# Patient Record
Sex: Female | Born: 1995
Health system: Southern US, Community
[De-identification: ages and names within clinical notes are randomized; demographics above are authoritative.]

## PROBLEM LIST (undated history)

## (undated) DIAGNOSIS — D573 Sickle-cell trait: Secondary | ICD-10-CM

## (undated) HISTORY — DX: Sickle-cell trait: D57.3

---
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
## 2013-08-22 ENCOUNTER — Ambulatory Visit (INDEPENDENT_AMBULATORY_CARE_PROVIDER_SITE_OTHER): Payer: 59 | Admitting: Family Medicine

## 2013-08-22 ENCOUNTER — Encounter: Payer: Self-pay | Admitting: Family Medicine

## 2013-08-22 VITALS — BP 102/67 | HR 85 | Temp 99.8°F | Wt 129.0 lb

## 2013-08-22 DIAGNOSIS — J039 Acute tonsillitis, unspecified: Secondary | ICD-10-CM

## 2013-08-22 DIAGNOSIS — J029 Acute pharyngitis, unspecified: Secondary | ICD-10-CM

## 2013-08-22 LAB — POCT RAPID STREP A (OFFICE): Rapid Strep A Screen: NEGATIVE

## 2013-08-22 MED ORDER — PREDNISONE 20 MG PO TABS: 40.0000 mg | ORAL_TABLET | Freq: Every day | ORAL | Status: DC

## 2013-08-22 NOTE — Addendum Note (Signed)
Addended by: Chalmers CaterUTTLE, Annalie Wenner H on: 08/22/2013 11:50 AM   Modules accepted: Orders

## 2013-08-22 NOTE — Progress Notes (Signed)
   Subjective:    Patient ID: Jacqueline Frederick, female    DOB: [birth year], 18 y.o.   MRN: [id number]  HPI Sore throat & fever for 3 days. She reports her boy friend has been sick. Pain with swallowing.  No cough, or congestion. No ear pain.  Boyfriend has tonsillitis. No meds.   Review of Systems     Objective:   Physical Exam  Constitutional: She is oriented to person, place, and time. She appears well-developed and well-nourished.  HENT:  Head: Normocephalic and atraumatic.  Right Ear: External ear normal.  Left Ear: External ear normal.  Nose: Nose normal.  Mouth/Throat: Oropharynx is clear and moist.  TMs and canals are clear.   Eyes: Conjunctivae and EOM are normal. Pupils are equal, round, and reactive to light.  Neck: Neck supple. No thyromegaly present.  Tonsils are swollen bilaterally with white spots. No significant erythema. She does have some mild cervical lymphadenopathy.  Cardiovascular: Normal rate, regular rhythm and normal heart sounds.   Pulmonary/Chest: Effort normal and breath sounds normal. She has no wheezes.  Lymphadenopathy:    She has cervical adenopathy.  Neurological: She is alert and oriented to person, place, and time.  Skin: Skin is warm and dry.  Psychiatric: She has a normal mood and affect.          Assessment & Plan:  Pharyngitis/tonsillitis - strep is negative today. Will send culture. Will hold off on antibiotics at this time. Will prescribe prednisone for 5 days. Call if feels like she suddenly getting worse or fever spikes. Make sure staying well hydrated.

## 2013-08-23 LAB — STREP A DNA PROBE: GASP: NEGATIVE

## 2013-08-25 ENCOUNTER — Encounter: Payer: Self-pay | Admitting: Physician Assistant

## 2013-08-25 ENCOUNTER — Ambulatory Visit (INDEPENDENT_AMBULATORY_CARE_PROVIDER_SITE_OTHER): Payer: 59 | Admitting: Physician Assistant

## 2013-08-25 VITALS — BP 99/61 | HR 67 | Ht 64.0 in | Wt 132.0 lb

## 2013-08-25 DIAGNOSIS — Z13220 Encounter for screening for lipoid disorders: Secondary | ICD-10-CM

## 2013-08-25 DIAGNOSIS — Z3009 Encounter for other general counseling and advice on contraception: Secondary | ICD-10-CM

## 2013-08-25 DIAGNOSIS — Z30011 Encounter for initial prescription of contraceptive pills: Secondary | ICD-10-CM

## 2013-08-25 DIAGNOSIS — Z131 Encounter for screening for diabetes mellitus: Secondary | ICD-10-CM

## 2013-08-25 DIAGNOSIS — Z0000 Encounter for general adult medical examination without abnormal findings: Secondary | ICD-10-CM

## 2013-08-25 MED ORDER — NORGESTIM-ETH ESTRAD TRIPHASIC 0.18/0.215/0.25 MG-25 MCG PO TABS: 1.0000 | ORAL_TABLET | Freq: Every day | ORAL | Status: DC

## 2013-08-25 NOTE — Progress Notes (Signed)
  Subjective:     Jacqueline Frederick is a 18 y.o. female and is here for a comprehensive physical exam. The patient reports no problems.  Patient would like to start birth control. She is not currently sexually active but may plan to be in the future. She has tried the birth of her patch at one point but did not like it. She was like to try pills today.  History   Social History  . Marital Status: Single    Spouse Name: N/A    Number of Children: N/A  . Years of Education: N/A   Occupational History  . student     Social History Main Topics  . Smoking status: Never Smoker   . Smokeless tobacco: Never Used  . Alcohol Use: No  . Drug Use: No  . Sexual Activity: Not on file   Other Topics Concern  . Not on file   Social History Narrative   [employer].    Health Maintenance  Topic Date Due  . Pap Smear  04/23/2011  . Influenza Vaccine  08/26/2013  . Tetanus/tdap  08/23/2021    The following portions of the patient's history were reviewed and updated as appropriate: allergies, current medications, past family history, past medical history, past social history, past surgical history and problem list.  Review of Systems A comprehensive review of systems was negative.   Objective:    BP 99/61  Pulse 67  Ht 5\' 4"  (1.626 m)  Wt 132 lb (59.875 kg)  BMI 22.65 kg/m2  LMP 08/25/2013 General appearance: alert, cooperative and appears stated age Head: Normocephalic, without obvious abnormality, atraumatic Eyes: conjunctivae/corneas clear. PERRL, EOM's intact. Fundi benign. Ears: normal TM's and external ear canals both ears Nose: Nares normal. Septum midline. Mucosa normal. No drainage or sinus tenderness. Throat: lips, mucosa, and tongue normal; teeth and gums normal Neck: no adenopathy, no carotid bruit, no JVD, supple, symmetrical, trachea midline and thyroid not enlarged, symmetric, no tenderness/mass/nodules Back: symmetric, no curvature. ROM normal. No CVA tenderness. Lungs:  clear to auscultation bilaterally Heart: regular rate and rhythm, S1, S2 normal, no murmur, click, rub or gallop Abdomen: soft, non-tender; bowel sounds normal; no masses,  no organomegaly Extremities: extremities normal, atraumatic, no cyanosis or edema Pulses: 2+ and symmetric Skin: Skin color, texture, turgor normal. No rashes or lesions Lymph nodes: Cervical, supraclavicular, and axillary nodes normal. Neurologic: Grossly normal    Assessment:    Healthy female exam.      Plan:     CPE- pap smear not due until next year. Vaccines up to date. Patient has not had screening labs done. Discussed benefit of having a baseline. Fasting labs are printed and given to the patient. Encouraged patient to continue exercise to maintain a healthy weight. Patient encouraged to go ahead and start a multivitamin with calcium and vitamin D. Encouraged at least 1200 mg or 4 servings of calcium daily.  Contraception management-discuss side effects of oral contraception. Discussed start date and what to do if this dose. Started patient on Ortho Tri-Cyclen Lo. Refills given for one year. Encouraged patient that this does not protect against sexually transmitted diseases. Use of condoms is important. If become sexually active please consider STD testing. See After Visit Summary for Counseling Recommendations

## 2013-08-25 NOTE — Patient Instructions (Addendum)
Oral Contraception Information Oral contraceptive pills (OCPs) are medicines taken to prevent pregnancy. OCPs work by preventing the ovaries from releasing eggs. The hormones in OCPs also cause the cervical mucus to thicken, preventing the sperm from entering the uterus. The hormones also cause the uterine lining to become thin, not allowing a fertilized egg to attach to the inside of the uterus. OCPs are highly effective when taken exactly as prescribed. However, OCPs do not prevent sexually transmitted diseases (STDs). Safe sex practices, such as using condoms along with the pill, can help prevent STDs.  Before taking the pill, you may have a physical exam and Pap test. Your health care provider may order blood tests. The health care provider will make sure you are a good candidate for oral contraception. Discuss with your health care provider the possible side effects of the OCP you may be prescribed. When starting an OCP, it can take 2 to 3 months for the body to adjust to the changes in hormone levels in your body.  TYPES OF ORAL CONTRACEPTION  The combination pill--This pill contains estrogen and progestin (synthetic progesterone) hormones. The combination pill comes in 21-day, 28-day, or 91-day packs. Some types of combination pills are meant to be taken continuously (365-day pills). With 21-day packs, you do not take pills for 7 days after the last pill. With 28-day packs, the pill is taken every day. The last 7 pills are without hormones. Certain types of pills have more than 21 hormone-containing pills. With 91-day packs, the first 84 pills contain both hormones, and the last 7 pills contain no hormones or contain estrogen only.  The minipill--This pill contains the progesterone hormone only. The pill is taken every day continuously. It is very important to take the pill at the same time each day. The minipill comes in packs of 28 pills. All 28 pills contain the hormone.  ADVANTAGES OF ORAL  CONTRACEPTIVE PILLS  Decreases premenstrual symptoms.   Treats menstrual period cramps.   Regulates the menstrual cycle.   Decreases a heavy menstrual flow.   May treatacne, depending on the type of pill.   Treats abnormal uterine bleeding.   Treats polycystic ovarian syndrome.   Treats endometriosis.   Can be used as emergency contraception.  THINGS THAT CAN MAKE ORAL CONTRACEPTIVE PILLS LESS EFFECTIVE OCPs can be less effective if:   You forget to take the pill at the same time every day.   You have a stomach or intestinal disease that lessens the absorption of the pill.   You take OCPs with other medicines that make OCPs less effective, such as antibiotics, certain HIV medicines, and some seizure medicines.   You take expired OCPs.   You forget to restart the pill on day 7, when using the packs of 21 pills.  RISKS ASSOCIATED WITH ORAL CONTRACEPTIVE PILLS  Oral contraceptive pills can sometimes cause side effects, such as:  Headache.  Nausea.  Breast tenderness.  Irregular bleeding or spotting. Combination pills are also associated with a small increased risk of:  Blood clots.  Heart attack.  Stroke. Document Released: 04/04/2002 Document Revised: 11/02/2012 Document Reviewed: 07/03/2012 [facility] Patient Information 2015 [place]. This information is not intended to replace advice given to you by your health care provider. Make sure you discuss any questions you have with your health care provider.  Keeping You Healthy  Get These Tests 1. Blood Pressure- Have your blood pressure checked once a year by your health care provider.  Normal blood pressure  is 120/80. 2. Weight- Have your body mass index (BMI) calculated to screen for obesity.  BMI is measure of body fat based on height and weight.  You can also calculate your own BMI at [email or web address].west-esparza.[email or web address] 3. Cholesterol- Have your cholesterol checked every 5 years starting at age 18  then yearly starting at age 54. 4. Chlamydia, HIV, and other sexually transmitted diseases- Get screened every year until age 68, then within three months of each new sexual provider. 5. Pap Smear- Every 1-3 years; discuss with your health care provider. 6. Mammogram- Every year starting at age 53  Take these medicines  Calcium with Vitamin D-Your body needs 1200 mg of Calcium each day and [phone] IU of Vitamin D daily.  Your body can only absorb 500 mg of Calcium at a time so Calcium must be taken in 2 or 3 divided doses throughout the day.  Multivitamin with folic acid- Once daily if it is possible for you to become pregnant.  Get these Immunizations  Gardasil-Series of three doses; prevents HPV related illness such as genital warts and cervical cancer.  Menactra-Single dose; prevents meningitis.  Tetanus shot- Every 10 years.  Flu shot-Every year.  Take these steps 1. Do not smoke-Your healthcare provider can help you quit.  For tips on how to quit go to www.smokefree.gov or call 1-800 QUITNOW. 2. Be physically active- Exercise 5 days a week for at least 30 minutes.  If you are not already physically active, start slow and gradually work up to 30 minutes of moderate physical activity.  Examples of moderate activity include walking briskly, dancing, swimming, bicycling, etc. 3. Breast Cancer- A self breast exam every month is important for early detection of breast cancer.  For more information and instruction on self breast exams, ask your healthcare provider or [email or web address]. 4. Eat a healthy diet- Eat a variety of healthy foods such as fruits, vegetables, whole grains, low fat milk, low fat cheeses, yogurt, lean meats, poultry and fish, beans, nuts, tofu, etc.  For more information go to www. Thenutritionsource.org 5. Drink alcohol in moderation- Limit alcohol intake to one drink or less per day. Never drink and drive. 6. Depression- Your emotional  health is as important as your physical health.  If you're feeling down or losing interest in things you normally enjoy please talk to your healthcare provider about being screened for depression. 7. Dental visit- Brush and floss your teeth twice daily; visit your dentist twice a year. 8. Eye doctor- Get an eye exam at least every 2 years. 9. Helmet use- Always wear a helmet when riding a bicycle, motorcycle, rollerblading or skateboarding. 10. Safe sex- If you may be exposed to sexually transmitted infections, use a condom. 11. Seat belts- Seat belts can save your live; always wear one. 12. Smoke/Carbon Monoxide detectors- These detectors need to be installed on the appropriate level of your home. Replace batteries at least once a year. 13. Skin cancer- When out in the sun please cover up and use sunscreen 15 SPF or higher. 14. Violence- If anyone is threatening or hurting you, please tell your healthcare provider.

## 2013-08-29 ENCOUNTER — Other Ambulatory Visit: Payer: Self-pay | Admitting: *Deleted

## 2013-08-29 ENCOUNTER — Telehealth: Payer: Self-pay | Admitting: *Deleted

## 2013-08-29 MED ORDER — NORGESTIM-ETH ESTRAD TRIPHASIC 0.18/0.215/0.25 MG-35 MCG PO TABS: 1.0000 | ORAL_TABLET | Freq: Every day | ORAL | Status: DC

## 2013-08-29 NOTE — Telephone Encounter (Signed)
Pt called and wanted to know if her ocp can be switched due to not being covered under ins.Loralee PacasBarkley, Temeca Somma [place]

## 2014-07-20 ENCOUNTER — Ambulatory Visit (INDEPENDENT_AMBULATORY_CARE_PROVIDER_SITE_OTHER): Payer: 59 | Admitting: Family Medicine

## 2014-07-20 ENCOUNTER — Encounter: Payer: Self-pay | Admitting: Family Medicine

## 2014-07-20 VITALS — BP 96/65 | HR 56 | Wt 127.0 lb

## 2014-07-20 DIAGNOSIS — R0602 Shortness of breath: Secondary | ICD-10-CM | POA: Diagnosis not present

## 2014-07-20 LAB — CBC
HEMATOCRIT: 38.9 % (ref 36.0–46.0)
Hemoglobin: 12.7 g/dL (ref 12.0–15.0)
MCH: 26.6 pg (ref 26.0–34.0)
MCHC: 32.6 g/dL (ref 30.0–36.0)
MCV: 81.6 fL (ref 78.0–100.0)
MPV: 10.3 fL (ref 8.6–12.4)
Platelets: 261 10*3/uL (ref 150–400)
RBC: 4.77 MIL/uL (ref 3.87–5.11)
RDW: 14.2 % (ref 11.5–15.5)
WBC: 3.9 10*3/uL — AB (ref 4.0–10.5)

## 2014-07-20 NOTE — Progress Notes (Signed)
CC: Jacqueline Frederick is a 19 y.o. female is here for weak and dizzy at times   Subjective: HPI:  For the past 2 weeks she will have episodes where she feels like she is becoming short of breath. Occurring 1-3 times a day. They always occur when she is sitting stationary or lying down. It begins with feeling like she's hyperventilating then dizziness. If she concentrates on her breathing for a few minutes the symptoms will pass. She denies any cough, wheezing, nor chest discomfort. She denies any exertional component to her symptoms. Symptoms have been persistent since onset and mild in severity. She denies fevers, chills, headache, hearing loss, vision disturbance, nasal congestion, sore throat or edema. Denies irregular heart beat or rapid heartbeat.she usually takes her pulse while she is having these episodes and she tells me its irregular rhythm and rate.   Review Of Systems Outlined In HPI  Past Medical History  Diagnosis Date  . Sickle cell trait   . Asthma     No past surgical history on file. Family History  Problem Relation Age of Onset  . Breast cancer Maternal Grandmother     History   Social History  . Marital Status: Single    Spouse Name: N/A  . Number of Children: N/A  . Years of Education: N/A   Occupational History  . student     Social History Main Topics  . Smoking status: Never Smoker   . Smokeless tobacco: Never Used  . Alcohol Use: No  . Drug Use: No  . Sexual Activity: Not on file   Other Topics Concern  . Not on file   Social History Narrative   [employer].      Objective: BP 96/65 mmHg  Pulse 56  Wt 127 lb (57.607 kg)  General: Alert and Oriented, No Acute Distress HEENT: Pupils equal, round, reactive to light. Conjunctivae clear.  External ears unremarkable, canals clear with intact TMs with appropriate landmarks.  Middle ear appears open without effusion. Pink inferior turbinates.  Moist mucous membranes, pharynx without inflammation nor  lesions.  Neck supple without palpable lymphadenopathy nor abnormal masses. Lungs: Clear to auscultation bilaterally, no wheezing/ronchi/rales.  Comfortable work of breathing. Good air movement. Extremities: No peripheral edema.  Strong peripheral pulses.  Mental Status: No depression, anxiety, nor agitation. Skin: Warm and dry.  Assessment & Plan: Chanae was seen today for weak and dizzy at times.  Diagnoses and all orders for this visit:  Shortness of breath Orders: -     Cancel: CBC -     CBC   Shortness of breath: We'll rule out anemia since she states that she does have heavy cycles every month. If this is normal but 2 of [place] have concluded that is most likely psychogenic and she'll think about medication to help her deal with stress.  Return if symptoms worsen or fail to improve.

## 2014-11-01 ENCOUNTER — Other Ambulatory Visit: Payer: Self-pay | Admitting: *Deleted

## 2014-11-01 ENCOUNTER — Other Ambulatory Visit: Payer: Self-pay | Admitting: Family Medicine

## 2015-07-29 ENCOUNTER — Other Ambulatory Visit (HOSPITAL_COMMUNITY)
Admission: RE | Admit: 2015-07-29 | Discharge: 2015-07-29 | Disposition: A | Discharge status: Home / Self Care | Payer: 59 | Source: Ambulatory Visit | Attending: Family Medicine | Admitting: Family Medicine

## 2015-07-29 ENCOUNTER — Encounter: Payer: Self-pay | Admitting: Family Medicine

## 2015-07-29 ENCOUNTER — Ambulatory Visit (INDEPENDENT_AMBULATORY_CARE_PROVIDER_SITE_OTHER): Payer: 59 | Admitting: Family Medicine

## 2015-07-29 VITALS — BP 97/63 | HR 62 | Wt 145.0 lb

## 2015-07-29 DIAGNOSIS — Z23 Encounter for immunization: Secondary | ICD-10-CM

## 2015-07-29 DIAGNOSIS — Z114 Encounter for screening for human immunodeficiency virus [HIV]: Secondary | ICD-10-CM

## 2015-07-29 DIAGNOSIS — Z01419 Encounter for gynecological examination (general) (routine) without abnormal findings: Secondary | ICD-10-CM | POA: Diagnosis not present

## 2015-07-29 DIAGNOSIS — D573 Sickle-cell trait: Secondary | ICD-10-CM | POA: Diagnosis not present

## 2015-07-29 DIAGNOSIS — Z0000 Encounter for general adult medical examination without abnormal findings: Secondary | ICD-10-CM | POA: Diagnosis not present

## 2015-07-29 MED ORDER — MICROGESTIN FE 1/20 1-20 MG-MCG PO TABS: ORAL_TABLET | ORAL | Status: DC

## 2015-07-29 NOTE — Progress Notes (Signed)
  Subjective:     Jacqueline Frederick is a 20 y.o. female and is here for a comprehensive physical exam. The patient reports no problems.She is potentially interested in switching to Nexplanon instead of oral contraceptives. She is currently sexually active. She rarely drinks alcohol. She denies smoking. She does exercise some but plans on starting back into a regular routine.  Social History   Social History  . Marital Status: Single    Spouse Name: N/A  . Number of Children: N/A  . Years of Education: N/A   Occupational History  . student     Social History Main Topics  . Smoking status: Never Smoker   . Smokeless tobacco: Never Used  . Alcohol Use: No  . Drug Use: No  . Sexual Activity: Not on file   Other Topics Concern  . Not on file   Social History Narrative   Jacqueline Frederick.    Health Maintenance  Topic Date Due  . HIV Screening  04/22/2008  . PAP SMEAR  04/23/2014  . INFLUENZA VACCINE  08/27/2015  . TETANUS/TDAP  08/23/2021    The following portions of the patient's history were reviewed and updated as appropriate: allergies, current medications, past family history, past medical history, past social history, past surgical history and problem list.  Review of Systems A comprehensive review of systems was negative.   Objective:    BP 97/63 mmHg  Pulse 62  Wt 145 lb (65.772 kg)  SpO2 100% General appearance: alert, cooperative and appears stated age Head: Normocephalic, without obvious abnormality, atraumatic Eyes: conj clear, EOMI, PEERLA Ears: normal TM's and external ear canals both ears Nose: Nares normal. Septum midline. Mucosa normal. No drainage or sinus tenderness. Throat: lips, mucosa, and tongue normal; teeth and gums normal Neck: no adenopathy, no carotid bruit, no JVD, supple, symmetrical, trachea midline and thyroid not enlarged, symmetric, no tenderness/mass/nodules Back: symmetric, no curvature. ROM normal. No CVA tenderness. Lungs: clear to  auscultation bilaterally Breasts: normal appearance, no masses or tenderness Heart: regular rate and rhythm, S1, S2 normal, no murmur, click, rub or gallop Abdomen: soft, non-tender; bowel sounds normal; no masses,  no organomegaly Pelvic: cervix normal in appearance, external genitalia normal, no adnexal masses or tenderness, no cervical motion tenderness, rectovaginal septum normal, uterus normal size, shape, and consistency and vagina normal without discharge Extremities: extremities normal, atraumatic, no cyanosis or edema Pulses: 2+ and symmetric Skin: Skin color, texture, turgor normal. No rashes or lesions Lymph nodes: Cervical, supraclavicular, and axillary nodes normal. Neurologic: Alert and oriented X 3, normal strength and tone. Normal symmetric reflexes. Normal coordination and gait    Assessment:    Healthy female exam.      Plan:     See After Visit Summary for Counseling Recommendations  Keep up a regular exercise program and make sure you are eating a healthy diet Try to eat 4 servings of dairy a day, or if you are lactose intolerant take a calcium with vitamin D daily.  Your vaccines are up to date.   Given second Gardisil vaccine today.  Additional information and handout provided for Nexplanon.

## 2015-07-29 NOTE — Addendum Note (Signed)
Addended by: Deno EtienneBARKLEY, Fuller Makin L on: 07/29/2015 04:30 PM   Modules accepted: Orders, SmartSet

## 2015-07-29 NOTE — Patient Instructions (Signed)
Etonogestrel implant What is this medicine? ETONOGESTREL (et oh noe JES trel) is a contraceptive (birth control) device. It is used to prevent pregnancy. It can be used for up to 3 years. This medicine may be used for other purposes; ask your health care provider or pharmacist if you have questions. What should I tell my health care provider before I take this medicine? They need to know if you have any of these conditions: -abnormal vaginal bleeding -blood vessel disease or blood clots -cancer of the breast, cervix, or liver -depression -diabetes -gallbladder disease -headaches -heart disease or recent heart attack -high blood pressure -high cholesterol -kidney disease -liver disease -renal disease -seizures -tobacco smoker -an unusual or allergic reaction to etonogestrel, other hormones, anesthetics or antiseptics, medicines, foods, dyes, or preservatives -pregnant or trying to get pregnant -breast-feeding How should I use this medicine? This device is inserted just under the skin on the inner side of your upper arm by a health care professional. Talk to your pediatrician regarding the use of this medicine in children. Special care may be needed. Overdosage: If you think you have taken too much of this medicine contact a poison control center or emergency room at once. NOTE: This medicine is only for you. Do not share this medicine with others. What if I miss a dose? This does not apply. What may interact with this medicine? Do not take this medicine with any of the following medications: -amprenavir -bosentan -fosamprenavir This medicine may also interact with the following medications: -barbiturate medicines for inducing sleep or treating seizures -certain medicines for fungal infections like ketoconazole and itraconazole -griseofulvin -medicines to treat seizures like carbamazepine, felbamate, oxcarbazepine, phenytoin,  topiramate -modafinil -phenylbutazone -rifampin -some medicines to treat HIV infection like atazanavir, indinavir, lopinavir, nelfinavir, tipranavir, ritonavir -St. John's wort This list may not describe all possible interactions. Give your health care provider a list of all the medicines, herbs, non-prescription drugs, or dietary supplements you use. Also tell them if you smoke, drink alcohol, or use illegal drugs. Some items may interact with your medicine. What should I watch for while using this medicine? This product does not protect you against HIV infection (AIDS) or other sexually transmitted diseases. You should be able to feel the implant by pressing your fingertips over the skin where it was inserted. Contact your doctor if you cannot feel the implant, and use a non-hormonal birth control method (such as condoms) until your doctor confirms that the implant is in place. If you feel that the implant may have broken or become bent while in your arm, contact your healthcare provider. What side effects may I notice from receiving this medicine? Side effects that you should report to your doctor or health care professional as soon as possible: -allergic reactions like skin rash, itching or hives, swelling of the face, lips, or tongue -breast lumps -changes in emotions or moods -depressed mood -heavy or prolonged menstrual bleeding -pain, irritation, swelling, or bruising at the insertion site -scar at site of insertion -signs of infection at the insertion site such as fever, and skin redness, pain or discharge -signs of pregnancy -signs and symptoms of a blood clot such as breathing problems; changes in vision; chest pain; severe, sudden headache; pain, swelling, warmth in the leg; trouble speaking; sudden numbness or weakness of the face, arm or leg -signs and symptoms of liver injury like dark yellow or brown urine; general ill feeling or flu-like symptoms; light-colored stools; loss of  appetite; nausea; right upper belly   pain; unusually weak or tired; yellowing of the eyes or skin -unusual vaginal bleeding, discharge -signs and symptoms of a stroke like changes in vision; confusion; trouble speaking or understanding; severe headaches; sudden numbness or weakness of the face, arm or leg; trouble walking; dizziness; loss of balance or coordination Side effects that usually do not require medical attention (Report these to your doctor or health care professional if they continue or are bothersome.): -acne -back pain -breast pain -changes in weight -dizziness -general ill feeling or flu-like symptoms -headache -irregular menstrual bleeding -nausea -sore throat -vaginal irritation or inflammation This list may not describe all possible side effects. Call your doctor for medical advice about side effects. You may report side effects to FDA at 1-800-FDA-1088. Where should I keep my medicine? This drug is given in a hospital or clinic and will not be stored at home. NOTE: This sheet is a summary. It may not cover all possible information. If you have questions about this medicine, talk to your doctor, pharmacist, or health care provider.    2016, Elsevier/Gold Standard. (2013-10-27 14:07:06)  

## 2015-08-01 LAB — CYTOLOGY - PAP

## 2015-08-20 ENCOUNTER — Encounter: Payer: Self-pay | Admitting: Osteopathic Medicine

## 2015-08-20 ENCOUNTER — Ambulatory Visit (INDEPENDENT_AMBULATORY_CARE_PROVIDER_SITE_OTHER): Payer: 59 | Admitting: Osteopathic Medicine

## 2015-08-20 VITALS — BP 108/71 | HR 62 | Wt 153.0 lb

## 2015-08-20 DIAGNOSIS — Z30017 Encounter for initial prescription of implantable subdermal contraceptive: Secondary | ICD-10-CM

## 2015-08-20 LAB — POCT URINE PREGNANCY: Preg Test, Ur: NEGATIVE

## 2015-08-20 MED ORDER — ETONOGESTREL 68 MG ~~LOC~~ IMPL: 1.0000 | DRUG_IMPLANT | Freq: Once | SUBCUTANEOUS | 0 refills | Status: DC

## 2015-08-20 NOTE — Patient Instructions (Signed)
Etonogestrel implant What is this medicine? ETONOGESTREL (et oh noe JES trel) is a contraceptive (birth control) device. It is used to prevent pregnancy. It can be used for up to 3 years. This medicine may be used for other purposes; ask your health care provider or pharmacist if you have questions. What should I tell my health care provider before I take this medicine? They need to know if you have any of these conditions: -abnormal vaginal bleeding -blood vessel disease or blood clots -cancer of the breast, cervix, or liver -depression -diabetes -gallbladder disease -headaches -heart disease or recent heart attack -high blood pressure -high cholesterol -kidney disease -liver disease -renal disease -seizures -tobacco smoker -an unusual or allergic reaction to etonogestrel, other hormones, anesthetics or antiseptics, medicines, foods, dyes, or preservatives -pregnant or trying to get pregnant -breast-feeding How should I use this medicine? This device is inserted just under the skin on the inner side of your upper arm by a health care professional. Talk to your pediatrician regarding the use of this medicine in children. Special care may be needed. Overdosage: If you think you have taken too much of this medicine contact a poison control center or emergency room at once. NOTE: This medicine is only for you. Do not share this medicine with others. What if I miss a dose? This does not apply. What may interact with this medicine? Do not take this medicine with any of the following medications: -amprenavir -bosentan -fosamprenavir This medicine may also interact with the following medications: -barbiturate medicines for inducing sleep or treating seizures -certain medicines for fungal infections like ketoconazole and itraconazole -griseofulvin -medicines to treat seizures like carbamazepine, felbamate, oxcarbazepine, phenytoin,  topiramate -modafinil -phenylbutazone -rifampin -some medicines to treat HIV infection like atazanavir, indinavir, lopinavir, nelfinavir, tipranavir, ritonavir -St. John's wort This list may not describe all possible interactions. Give your health care provider a list of all the medicines, herbs, non-prescription drugs, or dietary supplements you use. Also tell them if you smoke, drink alcohol, or use illegal drugs. Some items may interact with your medicine. What should I watch for while using this medicine? This product does not protect you against HIV infection (AIDS) or other sexually transmitted diseases. You should be able to feel the implant by pressing your fingertips over the skin where it was inserted. Contact your doctor if you cannot feel the implant, and use a non-hormonal birth control method (such as condoms) until your doctor confirms that the implant is in place. If you feel that the implant may have broken or become bent while in your arm, contact your healthcare provider. What side effects may I notice from receiving this medicine? Side effects that you should report to your doctor or health care professional as soon as possible: -allergic reactions like skin rash, itching or hives, swelling of the face, lips, or tongue -breast lumps -changes in emotions or moods -depressed mood -heavy or prolonged menstrual bleeding -pain, irritation, swelling, or bruising at the insertion site -scar at site of insertion -signs of infection at the insertion site such as fever, and skin redness, pain or discharge -signs of pregnancy -signs and symptoms of a blood clot such as breathing problems; changes in vision; chest pain; severe, sudden headache; pain, swelling, warmth in the leg; trouble speaking; sudden numbness or weakness of the face, arm or leg -signs and symptoms of liver injury like dark yellow or brown urine; general ill feeling or flu-like symptoms; light-colored stools; loss of  appetite; nausea; right upper belly   pain; unusually weak or tired; yellowing of the eyes or skin -unusual vaginal bleeding, discharge -signs and symptoms of a stroke like changes in vision; confusion; trouble speaking or understanding; severe headaches; sudden numbness or weakness of the face, arm or leg; trouble walking; dizziness; loss of balance or coordination Side effects that usually do not require medical attention (Report these to your doctor or health care professional if they continue or are bothersome.): -acne -back pain -breast pain -changes in weight -dizziness -general ill feeling or flu-like symptoms -headache -irregular menstrual bleeding -nausea -sore throat -vaginal irritation or inflammation This list may not describe all possible side effects. Call your doctor for medical advice about side effects. You may report side effects to FDA at 1-800-FDA-1088. Where should I keep my medicine? This drug is given in a hospital or clinic and will not be stored at home. NOTE: This sheet is a summary. It may not cover all possible information. If you have questions about this medicine, talk to your doctor, pharmacist, or health care provider.    2016, Elsevier/Gold Standard. (2013-10-27 14:07:06)  

## 2015-08-20 NOTE — Progress Notes (Signed)
HPI: Jacqueline Frederick is a 20 y.o. Not Hispanic or Latino female  who presents to [facility] [place] today, 08/20/15,  for chief complaint of:  Chief Complaint  Patient presents with  . Contraception    CONSULT FOR NEXPLANON    LMP - on period now. Previously on OCP as noted below, consistent with this medication. Has considered other options for contraception and is interested in Nexplanon arm implant.  Past medical, surgical, social and family history reviewed: Past Medical History:  Diagnosis Date  . Asthma   . Sickle cell trait (HCC)    No past surgical history on file. Social History  Substance Use Topics  . Smoking status: Never Smoker  . Smokeless tobacco: Never Used  . Alcohol use No   Family History  Problem Relation Age of Onset  . Breast cancer Maternal Grandmother      Current medication list and allergy/intolerance information reviewed:   Current Outpatient Prescriptions  Medication Sig Dispense Refill  . MICROGESTIN FE 1/20 1-20 MG-MCG tablet TAKE 1 T PO QD 1 Package 11   No current facility-administered medications for this visit.    No Known Allergies    Review of Systems:  Constitutional:  No  fever, no chills, No recent illness,  Genitourinary: No  abnormal genital bleeding, No abnormal genital discharge  Skin: No  Rash, No other wounds/concerning lesions  Exam:  BP 108/71   Pulse 62   Wt 153 lb (69.4 kg)   BMI 26.26 kg/m   Constitutional: VS see above. General Appearance: alert, well-developed, well-nourished, NAD  Skin: warm, dry, intact. No rash/ulcer.   Psychiatric: Normal judgment/insight. Normal mood and affect. Oriented x3.    Results for orders placed or performed in visit on 08/20/15 (from the past 24 hour(s))  POCT urine pregnancy     Status: None   Collection Time: 08/20/15 11:13 AM  Result Value Ref Range   Preg Test, Ur Negative Negative     ASSESSMENT/PLAN:   Nexplanon insertion  -  uneventful. All questions answered. Post procedure care was reviewed with the patient.   NEXPLANON INSERTION PRE-OP DIAGNOSIS: desired long-term, reversible contraception  POST-OP DIAGNOSIS: Same  PROCEDURE: Nexplanon  placement Performing Physician: Dr. Sunnie Nielsen  Risks and benefits reviewed with the patient. Informed consent obtained, patient opts to proceed today.    PROCEDURE:  Site (check): left arm  Lot #  Sterile Preparation: Chlorhexidine   Insertion site was selected 8 - 10 cm from medial epicondyle and marked along with guiding site using sterile marker  Procedure area was prepped and draped in a sterile fashion.3 mL of 1% lidocaine with epinephrine used for subcutaneous anesthesia. Anesthesia confirmed.  Nexplanon  trocar was inserted subcutaneously and then Nexplanon  capsule delivered subcutaneously Trocar was removed from the insertion site. Nexplanon  capsule was palpated by provider and patient to assure satisfactory placement. Estimated blood loss <1 mL Dressings applied: Steri-Strip and small pressure bandage Followup: The patient tolerated the procedure well without complications.  Standard post-procedure care is explained and return precautions are given.  Visit summary with medication list and pertinent instructions was printed for patient to review. All questions at time of visit were answered - patient instructed to contact office with any additional concerns. ER/RTC precautions were reviewed with the patient. Follow-up plan: Return as needed, and as directed by PCP for routine care.

## 2016-04-13 ENCOUNTER — Telehealth: Payer: Self-pay

## 2016-04-13 DIAGNOSIS — F322 Major depressive disorder, single episode, severe without psychotic features: Secondary | ICD-10-CM

## 2016-04-13 NOTE — Telephone Encounter (Signed)
Pt's mother called and stated that pt is having problems they think is related to her Nexplanon implant.  She has made an appointment for tomorrow afternoon with Lyn Hollingsheadlexander to have it removed.  She is also requesting a behavioral health referral.  Please advise.

## 2016-04-13 NOTE — Telephone Encounter (Signed)
OK to place referral for downstairs.

## 2016-04-14 ENCOUNTER — Ambulatory Visit (INDEPENDENT_AMBULATORY_CARE_PROVIDER_SITE_OTHER): Payer: 59 | Admitting: Osteopathic Medicine

## 2016-04-14 VITALS — BP 93/53 | HR 67 | Ht 63.0 in

## 2016-04-14 DIAGNOSIS — F322 Major depressive disorder, single episode, severe without psychotic features: Secondary | ICD-10-CM | POA: Diagnosis not present

## 2016-04-14 DIAGNOSIS — Z3046 Encounter for surveillance of implantable subdermal contraceptive: Secondary | ICD-10-CM

## 2016-04-14 DIAGNOSIS — T887XXA Unspecified adverse effect of drug or medicament, initial encounter: Secondary | ICD-10-CM | POA: Diagnosis not present

## 2016-04-14 NOTE — Telephone Encounter (Signed)
Dr Dreama SaaAtkar is going on vacation and doesn't have any appointments until mid April, so [place].

## 2016-04-14 NOTE — Telephone Encounter (Signed)
Notified. 

## 2016-04-14 NOTE — Patient Instructions (Signed)
Sutures need to come out in 10-14 days .  Anyone who is having thoughts of depression or suicide needs urgent evaluation to stay safe! You can always come talk to us or go to the nearest emergency room.

## 2016-04-14 NOTE — Telephone Encounter (Signed)
OK, thank you. Please let family know they should expect a call soon.

## 2016-04-14 NOTE — Progress Notes (Addendum)
HPI: Jacqueline Frederick is a 21 y.o. female  who presents to [facility] today, 04/14/16,  for chief complaint of:  Chief Complaint  Patient presents with  . Other    NEXPLANON REMOVAL    Nexplanon removal - concerned this may be causing depression.    Recently moved back home from [place]. Hasn't been eating. Sleeping all the time. No Hx depression or anxiety in the past. Patient is barely talking, mom provides most of history  Patient is accompanied by mom who assists with history-taking.   Past medical history, surgical history, social history and family history reviewed.  Patient Active Problem List   Diagnosis Date Noted  . Seborrheic dermatitis of scalp 04/09/2012  . Sickle cell trait (HCC) 02/06/2011    Current medication list and allergy/intolerance information reviewed.   Current Outpatient Prescriptions on File Prior to Visit  Medication Sig Dispense Refill  . etonogestrel (NEXPLANON) 68 MG IMPL implant 1 each (68 mg total) by Subdermal route once. Inserted 08/20/15 1 each 0   No current facility-administered medications on file prior to visit.    No Known Allergies    Review of Systems:  Constitutional: No recent illness  Cardiac: No  chest pain  Neurologic: No  weakness, No  Dizziness  Psychiatric: +concerns with depression, No  concerns with anxiety  Exam:  BP (!) 93/53   Pulse 67   Ht 5\' 3"  (1.6 m)   Constitutional: VS see above. General Appearance: well-developed, well-nourished, NAD.   Musculoskeletal: Gait normal. Symmetric and independent movement of all extremities  Neurological: Normal balance/coordination. No tremor.  Skin: warm, dry, intact.   Psychiatric: Poor judgment/insight. Flat mood and affect. Unable to determine thought disorder - pt will not respond verbally to most questions. No SI/HI.    Nexplanon Removal Procedure Note PRE-OP DIAGNOSIS: Nexplanon, desire for removal d/t depression POST-OP  DIAGNOSIS: Same  PROCEDURE: Nexplanon Removal  Performing Physician:A A;exander PROCEDURE:  Anesthesia: 2% Xylocaine 5 ml  Procedure: Consent obtained. A time-out was performed prior to initiating procedure to be sure of right patient and right location. The area surrounding the Nexplanon was prepared in the usual sterile manner. The site was anesthetized with lidocaine. A skin incision was made over the distal aspect of the device. Lack of LIdo w/ Epi on backorder - bleeding caused visibility problems. Tourniquet applied. Dr. Lucienne Minkshekkekandam's assistance was appreciated with remainde rof removal procedure. The capsule lysed sharply and the device removed using a hemostat. Hemostasis was assured. The site was closed with 4.0 prolene x3 simple interrupted suture and pressure dressing. The patient tolerated the procedure well.  Followup: The patient tolerated the procedure well without complications. Standard post-procedure care is explained and return precautions are given. Contraception is advised until conception is desired.    ASSESSMENT/PLAN:   Depression, major, single episode, severe (HCC) - Mom taking for urgent evaluation at [facility]. Safety plan in place and followup arranged w/ psych already  Nexplanon removal - Assistance of Dr. Benjamin Stainhekkekandam was appreciated    Patient Instructions  Sutures need to come out in 10-14 days .  Anyone who is having thoughts of depression or suicide needs urgent evaluation to stay safe! You can always come talk to us or go to the nearest emergency room.     Follow-up plan: Return for depression ASAP, suture removal 10-14 days.  Visit summary with medication list and pertinent instructions was printed for patient to review, alert us if any changes needed. All questions  at time of visit were answered - patient instructed to contact office with any additional concerns. ER/RTC precautions were reviewed with the patient and understanding verbalized.

## 2016-04-15 NOTE — Progress Notes (Signed)
  Procedure:  Removal of Nexplanon Risks, benefits, and alternatives explained and consent obtained. Time out conducted. Local anesthesia and initial incision performed by Dr. Sunnie NielsenNatalie Alexander. There was great difficulty with finding the Nexplanon for removal. I was called for further assistance, a tourniquet was applied to keep the operative field free of blood, using both sharp and blunt dissection as well as slightly extending the length of the incision proximally I was able to locate the Nexplanon device and remove it. Incision closure performed by Dr. Lyn HollingsheadAlexander. Hemostasis achieved. Pt stable.

## 2016-04-16 ENCOUNTER — Ambulatory Visit (INDEPENDENT_AMBULATORY_CARE_PROVIDER_SITE_OTHER): Payer: 59 | Admitting: Family Medicine

## 2016-04-16 ENCOUNTER — Encounter: Payer: Self-pay | Admitting: Family Medicine

## 2016-04-16 VITALS — BP 104/67 | HR 64 | Ht 63.0 in | Wt 149.0 lb

## 2016-04-16 DIAGNOSIS — F418 Other specified anxiety disorders: Secondary | ICD-10-CM | POA: Diagnosis not present

## 2016-04-16 MED ORDER — SERTRALINE HCL 50 MG PO TABS: ORAL_TABLET | ORAL | 1 refills | Status: DC

## 2016-04-16 NOTE — Patient Instructions (Signed)
Try to exercise for 20-30 minutes a day. We will work on getting referral to a therapist/counselor We will go ahead and start Zoloft and I'll see back in 3 weeks. Try to get family medical leave paperwork from your employer. They can fax it to our office directly if he would like.

## 2016-04-16 NOTE — Progress Notes (Signed)
Subjective:    CC: Depression/Anxiety   HPI:  21 year old female recently moved back to the area after living in [place]. She is here today with her mother. She actually came in earlier this week to remove her On as she was concerned that it actually could be causing some mood shifts including some symptoms of anxiety and depression. Per mom's report she has not been eating and has been sleeping all the time. In fact when she saw the other provider in our office she had recommended that she go to old been [facility] for a same-day consultation just to make sure that she was safe. Family felt that they did not feel that she needs to be admitted for inpatient behavioral health care at that time so she is following up. Today.  He comes in today feeling a little better. She is interested in getting some therapy and counseling. She has not had anything scary or traumatic her happen to her. She does not feel unsafe. She did break out, a relationship in January which has been difficult for her. She was working full time at [employer] in [place]. She started school at one point but was not actively taking classes at this time. She is now back on temporal rarely with her parents. She says they are very supportive and helping her get better. Else down and depressed and hopeless as well as feeling very nervous and anxious every day. She has had thoughts of wanting to harm herself but was evaluated [facility] ago and they felt that she was safe enough to go home. She still feels like she is safe to go home.  Past medical history, Surgical history, Family history not pertinant except as noted below, Social history, Allergies, and medications have been entered into the medical record, reviewed, and corrections made.   Review of Systems: No fevers, chills, night sweats, weight loss, chest pain, or shortness of breath.   Objective:    General: Well Developed, well nourished, and in no acute distress.   Neuro: Alert and oriented x3, extra-ocular muscles intact, sensation grossly intact.  HEENT: Normocephalic, atraumatic  Skin: Warm and dry, no rashes. Cardiac: Regular rate and rhythm, no murmurs rubs or gallops, no lower extremity edema.  Respiratory: Clear to auscultation bilaterally. Not using accessory muscles, speaking in full sentences.   Impression and Recommendations:    Anxiety/depression- Discussed options with her. Encouraged her to start exercising regularly. She can start with just walking for 2030 minutes a day. Also discussed referral for therapy/counseling the just be very helpful at this time in her life when she struggling. We also discussed putting her on a prescription medication. She was open to all 3 options and said she would like to do all 3. We will start with Zoloft and I'll see her back in 3 weeks. One about potential for side effects. We'll go ahead and then start exercise program. And we actually already placed the pedicle health referral 2 days ago.

## 2016-04-21 ENCOUNTER — Telehealth: Payer: Self-pay

## 2016-04-21 NOTE — Telephone Encounter (Signed)
Called pt and lvm informing her that we do not have the fmla forms and she can either fax it to our office or drop this off.Loralee PacasBarkley, Jarrod Mcenery [place]

## 2016-04-21 NOTE — Telephone Encounter (Signed)
ok 

## 2016-04-21 NOTE — Telephone Encounter (Signed)
Pt called with fax # for FMLA paperwork [phone]

## 2016-04-23 ENCOUNTER — Ambulatory Visit (INDEPENDENT_AMBULATORY_CARE_PROVIDER_SITE_OTHER): Payer: 59 | Admitting: Psychiatry

## 2016-04-23 ENCOUNTER — Ambulatory Visit: Payer: 59 | Admitting: Obstetrics & Gynecology

## 2016-04-23 ENCOUNTER — Encounter (HOSPITAL_COMMUNITY): Payer: Self-pay | Admitting: Psychiatry

## 2016-04-23 VITALS — BP 102/64 | HR 55 | Ht 64.5 in | Wt 148.0 lb

## 2016-04-23 DIAGNOSIS — Z818 Family history of other mental and behavioral disorders: Secondary | ICD-10-CM | POA: Diagnosis not present

## 2016-04-23 DIAGNOSIS — F419 Anxiety disorder, unspecified: Secondary | ICD-10-CM | POA: Diagnosis not present

## 2016-04-23 DIAGNOSIS — F321 Major depressive disorder, single episode, moderate: Secondary | ICD-10-CM

## 2016-04-23 DIAGNOSIS — Z79899 Other long term (current) drug therapy: Secondary | ICD-10-CM | POA: Diagnosis not present

## 2016-04-23 MED ORDER — SERTRALINE HCL 50 MG PO TABS: ORAL_TABLET | ORAL | 0 refills | Status: DC

## 2016-04-23 NOTE — Progress Notes (Signed)
Psychiatric Initial Adult Assessment   Patient Identification: Jacqueline Frederick MRN:  [id number] Date of Evaluation:  04/23/2016 Referral Source: Self referred Chief Complaint:   Chief Complaint    Anxiety; Establish Care     Visit Diagnosis:    ICD-9-CM ICD-10-CM   1. Moderate single current episode of major depressive disorder (HCC) 296.22 F32.1 sertraline (ZOLOFT) 50 MG tablet    History of Present Illness:  Jacqueline Frederick is 21 year old African-American single, employed self-referred female for the management of depression and anxiety symptoms.  Patient endorse she has long history of anxiety and depression but symptoms started to get worse since January when she had breakup.  Patient told relationship was 55 months old but they new each other for more than few years.  Patient told her boyfriend told that they are not compatible but patient believe depression is also the reason relationship did not work.  Patient has multiple failed relationship in the past.  She endorse October the breakup she was feeling very sad depressed and having passive and fleeting suicidal thoughts.  She decided to see a therapist but her therapist felt that she is fine and she decided not to continue counseling further.  Patient told she is experiencing crying spells, fatigue, lack of energy, social withdrawn, lack of motivation and stays most of the time to herself.  She endorsed poor sleep, racing thoughts and feeling very anxious and scared about her future.  She admitted also feeling paranoid that people are talking about her but denies any hallucination.  She denies any panic attack, OCD symptoms, PTSD symptoms or any self abusive behavior.  She admitted some time indecisive and difficult to concentrate to make decision.  Lately she has poor appetite and she believe she may have lost a few pounds.  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  She decided to move [place]  last September so she can live close to her mother.  Currently she is off from work because she was unable to function in her job.  She is staying with her father who is a employee at [employer].  Patient also endorse history of heavy drinking in the past when she was in college but lately she had cut down her drinking.  Patient denies any illegal substance use.  Her primary care physician recently started her on Zoloft 50 mg one week ago.  She is taking the medication and reported no side effects.  She is hoping medicine helps her anxiety and depression.  Patient denies any aggressive behavior, mood swing, mania but endorsed some time having nightmares.  Associated Signs/Symptoms: Depression Symptoms:  depressed mood, anhedonia, insomnia, fatigue, difficulty concentrating, hopelessness, anxiety, loss of energy/fatigue, (Hypo) Manic Symptoms:  Irritable Mood, Anxiety Symptoms:  Excessive Worry, Social Anxiety, Psychotic Symptoms:  Patient denies any psychotic symptoms. PTSD Symptoms: NA  Past Psychiatric History: Patient endorse history of depression and anxiety when she started college.  However she has never taken any psychiatric medication until recently started Zoloft by her primary care physician.  Patient denies any history of suicidal attempt, mania, psychosis or any self abusive behavior.  Previous Psychotropic Medications: No   Substance Abuse History in the last 12 months:  Yes.    Consequences of Substance Abuse: Blackouts:  Patient endorse history of heavy drinking in the past causing blackouts.  Past Medical History:  Past Medical History:  Diagnosis Date  . Asthma   . Sickle cell trait (HCC)  History reviewed. No pertinent surgical history.  Family Psychiatric History: Patient endorse aunt has mania and great uncle has depression.  Family History:  Family History  Problem Relation Age of Onset  . Breast cancer Maternal Grandmother   . Bipolar disorder  Paternal Aunt     Social History:   Social History   Social History  . Marital status: Single    Spouse name: N/A  . Number of children: N/A  . Years of education: N/A   Occupational History  . student     Social History Main Topics  . Smoking status: Never Smoker  . Smokeless tobacco: Never Used  . Alcohol use No  . Drug use: No  . Sexual activity: No   Other Topics Concern  . None   Social History Narrative   [employer].     Additional Social History: Patient was born in [place].  Her parents were separated and divorced at early age.  She was raised I her mother until seventh grade and then moved with her father.  She lived with the father until September 2017 decided to live with her mother in [place].  Patient never married.  She has no children.  She admitted multiple failed relationship in the past.    Allergies:  No Known Allergies  Metabolic Disorder Labs: No results found for: HGBA1C, MPG No results found for: PROLACTIN No results found for: CHOL, TRIG, HDL, CHOLHDL, VLDL, LDLCALC   Current Medications: Current Outpatient Prescriptions  Medication Sig Dispense Refill  . sertraline (ZOLOFT) 50 MG tablet Take 1 and 1/2 tab po daily 45 tablet 0   No current facility-administered medications for this visit.     Neurologic: Headache: Yes Seizure: No Paresthesias:No  Musculoskeletal: Strength & Muscle Tone: within normal limits Gait & Station: normal Patient leans: N/A  Psychiatric Specialty Exam: Review of Systems  Constitutional: Positive for malaise/fatigue and weight loss.  HENT: Negative.   Respiratory: Negative.   Cardiovascular: Negative.   Musculoskeletal: Negative.   Skin: Negative.   Neurological: Positive for headaches.  Psychiatric/Behavioral: Positive for depression. The patient is nervous/anxious and has insomnia.     Blood pressure 102/64, pulse (!) 55, height 5' 4.5" (1.638 m), weight 148 lb (67.1 kg).Body mass index is 25.01  kg/m.  General Appearance: Casual and Shy  Eye Contact:  Fair  Speech:  Slow  Volume:  Decreased  Mood:  Anxious, Depressed and Dysphoric  Affect:  Constricted and Depressed  Thought Process:  Goal Directed  Orientation:  Full (Time, Place, and Person)  Thought Content:  Illogical and Rumination  Suicidal Thoughts:  No  Homicidal Thoughts:  No  Memory:  Immediate;   Good Recent;   Good Remote;   Good  Judgement:  Good  Insight:  Good  Psychomotor Activity:  Decreased  Concentration:  Concentration: Fair and Attention Span: Fair  Recall:  Good  Fund of Knowledge:Good  Language: Good  Akathisia:  No  Handed:  Right  AIMS (if indicated):  0  Assets:  Communication Skills Desire for Improvement Housing Resilience Social Support  ADL's:  Intact  Cognition: WNL  Sleep:  Fair    Assessment: Major depressive disorder, recurrent.  Anxiety disorder NOS.  Plan: I review her symptoms, psychosocial stressors, current medication and collateral information from other provider.  She is taking Zoloft 50 mg and I recommended to increase 75 mg daily, patient is tolerating well without any side effects.  I also discussed that she should consider intensive outpatient  program for coping skills.  Program information was given to the patient and her father who was present in the session.  I had a long discussion with the patient about long-term prognosis and medication side effects and efficacy.  Patient will call us back once she make that decision about intensive outpatient program.  Recommended to call us back if she has any question, concern or if she feels worsening of the symptom.  Discuss safety plan that anytime having active suicidal thoughts or homicidal thought and she need to call 911 or go to the local emergency room.  Follow-up in 4 weeks.   Maleeya Peterkin T., MD 3/29/20189:50 AM

## 2016-04-30 ENCOUNTER — Ambulatory Visit (INDEPENDENT_AMBULATORY_CARE_PROVIDER_SITE_OTHER): Payer: 59 | Admitting: Family Medicine

## 2016-04-30 VITALS — BP 93/58 | HR 63

## 2016-04-30 DIAGNOSIS — Z4802 Encounter for removal of sutures: Secondary | ICD-10-CM

## 2016-04-30 NOTE — Progress Notes (Signed)
Pt in today for suture removal from left arm.  Site is healing well.  Dr. Karie Schwalbe looked at the incision site for verification.

## 2016-05-11 ENCOUNTER — Telehealth (HOSPITAL_COMMUNITY): Payer: Self-pay

## 2016-05-11 NOTE — Telephone Encounter (Signed)
Patient called and is confused about what she should be doing. She states that you recommended IOP, however they met with her and did not think that she would be a good fit for that program. Patient would like to talk to you - her call back number is [phone]

## 2016-05-12 ENCOUNTER — Other Ambulatory Visit (HOSPITAL_COMMUNITY): Payer: Self-pay | Admitting: Psychiatry

## 2016-05-12 NOTE — Telephone Encounter (Signed)
Called patient back and let her know that she could attend the IOP, she decided that she would do this. I passed her information along to [place].

## 2016-05-12 NOTE — Telephone Encounter (Signed)
If she is not interested in intensive outpatient program she can see individual counselor for therapy

## 2016-05-28 ENCOUNTER — Encounter (HOSPITAL_COMMUNITY): Payer: Self-pay | Admitting: Psychiatry

## 2016-05-28 ENCOUNTER — Ambulatory Visit (INDEPENDENT_AMBULATORY_CARE_PROVIDER_SITE_OTHER): Payer: 59 | Admitting: Psychiatry

## 2016-05-28 DIAGNOSIS — Z818 Family history of other mental and behavioral disorders: Secondary | ICD-10-CM | POA: Diagnosis not present

## 2016-05-28 DIAGNOSIS — F321 Major depressive disorder, single episode, moderate: Secondary | ICD-10-CM

## 2016-05-28 DIAGNOSIS — F339 Major depressive disorder, recurrent, unspecified: Secondary | ICD-10-CM | POA: Diagnosis not present

## 2016-05-28 DIAGNOSIS — F419 Anxiety disorder, unspecified: Secondary | ICD-10-CM

## 2016-05-28 MED ORDER — SERTRALINE HCL 50 MG PO TABS: ORAL_TABLET | ORAL | 2 refills | Status: DC

## 2016-05-28 NOTE — Progress Notes (Signed)
BH MD/PA/NP OP Progress Note  05/28/2016 3:36 PM Jacqueline Frederick  MRN:  [id number]  Chief Complaint:  Chief Complaint    Follow-up     Subjective:  I am doing much better.  I'm seeing therapist in [facility] for CBT.  HPI: Jacqueline Frederick came for her follow-up appointment with her father.  She was seen first time 5 weeks ago because of depression and anxiety symptoms.  She was taking Zoloft 50 mg and she was recommended to increase 75 mg.  She has seen much improvement.  She denies any crying spells or any feeling of hopelessness or worthlessness.  She is seeing therapist named Jacqueline Frederick in [place] for CBT.  Overall he described her sleep is improved but she still gets some time.  Of anxiety and nervousness.  Her energy level is improved from the past.  She denies any suicidal thoughts or homicidal thought.  She has no tremors shakes or any EPS.  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  Patient is on disability until she fully recovered to go back to work.  Her last day was March 16.  She is on disability but awaiting paperwork to be completed.  Patient is still not ready to go back to work.  We have offered intensive outpatient program but patient feels more comfortable with individual counseling.  She still have episodes of lack of motivation and racing thoughts but overall she feels medicine working.  She has no paranoia or any panic attacks.  Patient is not ready to start any new relationship.  She is not drinking or using any illegal substances.  Her vital signs are stable.  Visit Diagnosis:    ICD-9-CM ICD-10-CM   1. Moderate single current episode of major depressive disorder (HCC) 296.22 F32.1 sertraline (ZOLOFT) 50 MG tablet    Past Psychiatric History:  Patient reported history of depression and anxiety when she started college.   Recently she was prescribed Zoloft by her primary care physician.  Patient denies any history of suicidal attempt, mania, psychosis or any hallucination.  Past Medical History:  Past Medical History:  Diagnosis Date  . Asthma   . Sickle cell trait (HCC)    No past surgical history on file.  Family Psychiatric History: Reviewed.  Family History:  Family History  Problem Relation Age of Onset  . Breast cancer Maternal Grandmother   . Bipolar disorder Paternal Aunt     Social History:  Social History   Social History  . Marital status: Single    Spouse name: N/A  . Number of children: N/A  . Years of education: N/A   Occupational History  . student     Social History Main Topics  . Smoking status: Never Smoker  . Smokeless tobacco: Never Used  . Alcohol use No  . Drug use: No  . Sexual activity: No   Other Topics Concern  . None   Social History Narrative   Jacqueline Frederick.     Allergies: No Known Allergies  Metabolic Disorder Labs: No results found for: HGBA1C, MPG No results found for: PROLACTIN No results found for: CHOL, TRIG, HDL, CHOLHDL, VLDL, LDLCALC   Current Medications: Current Outpatient Prescriptions  Medication Sig Dispense Refill  . sertraline (ZOLOFT) 50 MG tablet Take 1 and 1/2 tab po daily 45 tablet 0  No current facility-administered medications for this visit.     Neurologic: Headache: No Seizure: No Paresthesias: No  Musculoskeletal: Strength & Muscle Tone: within normal limits Gait & Station: normal Patient leans: N/A  Psychiatric Specialty Exam: ROS  Blood pressure 102/64, pulse 75, height 5' 4.5" (1.638 m), weight 138 lb (62.6 kg).Body mass index is 23.32 kg/m.  General Appearance: Casual  Eye Contact:  Fair  Speech:  Slow  Volume:  Normal  Mood:  Anxious  Affect:  Appropriate  Thought Process:  Goal Directed  Orientation:  Full (Time, Place, and Person)  Thought Content: WDL and Logical   Suicidal Thoughts:  No   Homicidal Thoughts:  No  Memory:  Immediate;   Good Recent;   Fair Remote;   Good  Judgement:  Good  Insight:  Good  Psychomotor Activity:  Normal  Concentration:  Concentration: Good and Attention Span: Good  Recall:  Good  Fund of Knowledge: Good  Language: Good  Akathisia:  No  Handed:  Right  AIMS (if indicated):  0  Assets:  Communication Skills Desire for Improvement Housing Physical Health Resilience Social Support  ADL's:  Intact  Cognition: WNL  Sleep:  Improved     Assessment: Major depressive disorder, recurrent.  Anxiety disorder NOS.  Plan: Patient is doing better with Zoloft 75 mg.  She has no side effects.  Patient is still on disability for short-term starting date March 16 until June 16.  We do not received any paperwork however I suggested to have patient call human resources to fax paperwork to be completed.  Discussed medication side effects and benefits.  Encouraged to continue therapy for CBT.  Recommended to call us back if she has any question, concern or if he feel worsening of the symptom.  Follow-up in 3 months.  Floy Angert T., MD 05/28/2016, 3:36 PM

## 2016-06-26 ENCOUNTER — Telehealth (HOSPITAL_COMMUNITY): Payer: Self-pay

## 2016-06-26 NOTE — Telephone Encounter (Signed)
Patient is calling because she is feeling very sluggish and tired, she is wondering if it is her Zoloft. Please review and advise, thank you

## 2016-06-26 NOTE — Telephone Encounter (Signed)
Called patient, she is already taking the Zoloft at night so she is going to decrease to 50 mg for awhile and see how she does.

## 2016-06-26 NOTE — Telephone Encounter (Signed)
She can take Zoloft at night.  Still complaining of sluggishness and she can try 50 mg instead of 75 mg.  He usually symptoms of sedation and sluggishness goes away after a few weeks.

## 2016-07-30 ENCOUNTER — Other Ambulatory Visit (HOSPITAL_COMMUNITY): Payer: Self-pay

## 2016-07-30 ENCOUNTER — Ambulatory Visit (INDEPENDENT_AMBULATORY_CARE_PROVIDER_SITE_OTHER): Payer: 59 | Admitting: Family Medicine

## 2016-07-30 ENCOUNTER — Telehealth: Payer: Self-pay | Admitting: Family Medicine

## 2016-07-30 ENCOUNTER — Encounter: Payer: Self-pay | Admitting: Family Medicine

## 2016-07-30 VITALS — BP 106/70 | HR 70 | Resp 16 | Wt 132.9 lb

## 2016-07-30 DIAGNOSIS — F321 Major depressive disorder, single episode, moderate: Secondary | ICD-10-CM

## 2016-07-30 DIAGNOSIS — F329 Major depressive disorder, single episode, unspecified: Secondary | ICD-10-CM | POA: Diagnosis not present

## 2016-07-30 DIAGNOSIS — E049 Nontoxic goiter, unspecified: Secondary | ICD-10-CM

## 2016-07-30 DIAGNOSIS — F32A Depression, unspecified: Secondary | ICD-10-CM | POA: Insufficient documentation

## 2016-07-30 LAB — CBC
HCT: 39.1 % (ref 35.0–45.0)
Hemoglobin: 12.4 g/dL (ref 11.7–15.5)
MCH: 26.3 pg — AB (ref 27.0–33.0)
MCHC: 31.7 g/dL — AB (ref 32.0–36.0)
MCV: 83 fL (ref 80.0–100.0)
MPV: 10.3 fL (ref 7.5–12.5)
PLATELETS: 250 10*3/uL (ref 140–400)
RBC: 4.71 MIL/uL (ref 3.80–5.10)
RDW: 13.8 % (ref 11.0–15.0)
WBC: 4.9 10*3/uL (ref 3.8–10.8)

## 2016-07-30 LAB — TSH: TSH: 1.31 m[IU]/L

## 2016-07-30 LAB — T4, FREE: FREE T4: 1 ng/dL (ref 0.8–1.8)

## 2016-07-30 MED ORDER — SERTRALINE HCL 50 MG PO TABS: ORAL_TABLET | ORAL | 2 refills | Status: DC

## 2016-07-30 NOTE — Progress Notes (Signed)
   Subjective:    Patient ID: Jacqueline Frederick, female    DOB: 16-Dec-1995, 21 y.o.   MRN: [id number]  HPI  21 year old female with a history of major depressive disorder comes in today wanting to discuss her thyroid. Says her mom has thyroid problems and her mom noticed her neck seemed a little swollen and wanted her to get checked.  She denies any recent skin or hair changes. She denies any weight changes. She has felt more fatigued and low energy though. No palpitations.  She screen positive for depression on her PHQ- 2. She does see a therapist about every 3-4 weeks but feels like it isn't helping mucy. She has finished college and currently is not working. She feels stressed that she's not helping her family out more monetarily. She says not sure what direction she wants to go in with her schooling and career.  Review of Systems  BP 106/70   Pulse 70   Resp 16   Wt 132 lb 14.4 oz (60.3 kg)   BMI 22.46 kg/m     No Known Allergies  Past Medical History:  Diagnosis Date  . Asthma   . Sickle cell trait (HCC)     No past surgical history on file.  Social History   Social History  . Marital status: Single    Spouse name: N/A  . Number of children: N/A  . Years of education: N/A   Occupational History  . student     Social History Main Topics  . Smoking status: Never Smoker  . Smokeless tobacco: Never Used  . Alcohol use No  . Drug use: No  . Sexual activity: No   Other Topics Concern  . Not on file   Social History Narrative   Jacqueline Frederick.     Family History  Problem Relation Age of Onset  . Breast cancer Maternal Grandmother   . Bipolar disorder Paternal Aunt     Outpatient Encounter Prescriptions as of 07/30/2016  Medication Sig  . sertraline (ZOLOFT) 50 MG tablet Take 1 and 1/2 tab po daily   No facility-administered encounter medications on file as of 07/30/2016.          Objective:   Physical Exam  Constitutional: She is oriented to person, place, and  time. She appears well-developed and well-nourished.  HENT:  Head: Normocephalic and atraumatic.  Neck: Neck supple. No thyromegaly present.  Thyroid feels symmetric. I do not feel any distinct nodules. It is just slightly enlarged anteriorly but not in with it.  Pulmonary/Chest: Effort normal and breath sounds normal.  Lymphadenopathy:    She has no cervical adenopathy.  Neurological: She is alert and oriented to person, place, and time.  Skin: Skin is warm and dry.  Psychiatric: She has a normal mood and affect. Her behavior is normal.        Assessment & Plan:  Borderline enlarged thyroid-we'll get lab work and schedule for ultrasound for further evaluation. Call with results once available.  Depression, acute-PHQ 9 score of 15 today. I think at this point she would benefit from referral to psychiatry. She's really not feeling like she is getting a lot of benefit from her current therapist. She might really even benefit from medication at this point. We also discussed that sometimes people don't always know what they want to really do for a living. Sometimes it's just picking something and really working towards that goal. She is currently on Zoloft 75 mg daily.

## 2016-07-30 NOTE — Telephone Encounter (Signed)
Called patient and gave per doctors recommendation. The patient does state that she HAS had thoughts of harming herself but she states she has not had those thoughts today and does not have a plan. She states "I'm ok." Encouraged her to call her psychiatrist to get in with them ASAP. The patient is comfortable with her psychiatrist and does not want to change psychiatrist at this point.

## 2016-07-30 NOTE — Telephone Encounter (Signed)
Call patient: She did screen high on her depression questionnaire today. I know she has a psychiatrist that right for her medication. She may want to get back on it in with them ASAP so that they can talk and adjust her medication. If it some point she feels more comfortable with getting a consult with a new psychiatrist on be happy to work with her on that. He also marked as she has had thoughts of being better off dead. Please discuss with her to see if she is having active suicidal thoughts .

## 2016-08-28 ENCOUNTER — Encounter (HOSPITAL_COMMUNITY): Payer: Self-pay | Admitting: Psychiatry

## 2016-08-28 ENCOUNTER — Ambulatory Visit (INDEPENDENT_AMBULATORY_CARE_PROVIDER_SITE_OTHER): Payer: 59 | Admitting: Psychiatry

## 2016-08-28 DIAGNOSIS — F419 Anxiety disorder, unspecified: Secondary | ICD-10-CM

## 2016-08-28 DIAGNOSIS — F321 Major depressive disorder, single episode, moderate: Secondary | ICD-10-CM

## 2016-08-28 DIAGNOSIS — Z818 Family history of other mental and behavioral disorders: Secondary | ICD-10-CM | POA: Diagnosis not present

## 2016-08-28 NOTE — Progress Notes (Signed)
BH MD/PA/NP OP Progress Note  08/28/2016 10:14 AM Jacqueline Frederick  MRN:  [id number]  Chief Complaint:  Subjective:  I am anxious.  I stopping medication for few days but relies that anxiety is getting worse.  HPI: Jacqueline Frederick came for her follow-up appointment.  She admitted noncompliant with medication for few days and then relapsed into severe depression.  On her last visit we recommended to try Zoloft 75 mg but she called and mentioned that it is making her sluggish.  She recently [place] and she get more anxious and nervous.  She feels guilty when talking to her mother.  Her mother realize that she did the mistake leaving [place].  Patient is living with her father and stepmother.  Currently she is not working but actively looking for a job.  She has few interviews.  She admitted recently increased crying spells, guilt about her past, negative thinking, anxiety, nervousness and having few panic attacks.  She does not feel comfortable around people.  She is also not happy with the current therapist.  Though she denies any suicidal thoughts or homicidal thought but appears nervous and anxious and feeling overwhelmed.  She sleeping good.  She denies drinking alcohol or using any illegal substances.  She lives with her father and a stepmother who are supportive.  Visit Diagnosis:    ICD-10-CM   1. Moderate single current episode of major depressive disorder (HCC) F32.1     Past Psychiatric History: Reviewed. Patient reported history of depression and anxiety when she started college.  Recently she was prescribed Zoloft by her primary care physician.  Patient denies any history of suicidal attempt, mania, psychosis or any hallucination.  Past Medical History:  Past Medical History:  Diagnosis Date  . Asthma   . Sickle cell trait (HCC)    History reviewed. No pertinent surgical history.  Family Psychiatric History: Reviewed.  Family History:  Family History  Problem  Relation Age of Onset  . Breast cancer Maternal Grandmother   . Bipolar disorder Paternal Aunt     Social History:  Social History   Social History  . Marital status: Single    Spouse name: N/A  . Number of children: N/A  . Years of education: N/A   Occupational History  . student     Social History Main Topics  . Smoking status: Never Smoker  . Smokeless tobacco: Never Used  . Alcohol use No  . Drug use: No  . Sexual activity: No   Other Topics Concern  . None   Social History Narrative   [employer].     Allergies: No Known Allergies  Metabolic Disorder Labs: Recent Results (from the past 2160 hour(s))  TSH     Status: None   Collection Time: 07/30/16 10:11 AM  Result Value Ref Range   TSH 1.31 mIU/L    Comment:   Reference Range   > or = 20 Years  0.40-4.50   Pregnancy Range First trimester  0.26-2.66 Second trimester 0.55-2.73 Third trimester  0.43-2.91     T4, free     Status: None   Collection Time: 07/30/16 10:11 AM  Result Value Ref Range   Free T4 1.0 0.8 - 1.8 ng/dL  CBC     Status: Abnormal   Collection Time: 07/30/16 10:11 AM  Result Value Ref Range   WBC 4.9 3.8 - 10.8 K/uL   RBC 4.71 3.80 - 5.10 MIL/uL   Hemoglobin 12.4 11.7 - 15.5 g/dL  HCT 39.1 35.0 - 45.0 %   MCV 83.0 80.0 - 100.0 fL   MCH 26.3 (L) 27.0 - 33.0 pg   MCHC 31.7 (L) 32.0 - 36.0 g/dL   RDW 16.113.8 09.611.0 - 04.515.0 %   Platelets 250 140 - 400 K/uL   MPV 10.3 7.5 - 12.5 fL   No results found for: HGBA1C, MPG No results found for: PROLACTIN No results found for: CHOL, TRIG, HDL, CHOLHDL, VLDL, LDLCALC   Current Medications: Current Outpatient Prescriptions  Medication Sig Dispense Refill  . sertraline (ZOLOFT) 50 MG tablet Take 1 and 1/2 tab po daily 45 tablet 2   No current facility-administered medications for this visit.     Neurologic: Headache: No Seizure: No Paresthesias: No  Musculoskeletal: Strength & Muscle Tone: within normal limits Gait & Station:  normal Patient leans: N/A  Psychiatric Specialty Exam: Review of Systems  Constitutional: Negative.   HENT: Negative.   Respiratory: Negative.   Cardiovascular: Negative.   Genitourinary: Negative.   Musculoskeletal: Negative.   Skin: Negative.   Neurological: Negative.   Psychiatric/Behavioral: Positive for depression. The patient is nervous/anxious.     Blood pressure 102/64, pulse 68, height 5\' 4"  (1.626 m), weight 133 lb 3.2 oz (60.4 kg).Body mass index is 22.86 kg/m.  General Appearance: Casual and shy anxious  Eye Contact:  Fair  Speech:  Clear and Coherent  Volume:  Decreased  Mood:  Anxious  Affect:  Congruent  Thought Process:  Goal Directed  Orientation:  Full (Time, Place, and Person)  Thought Content: Logical   Suicidal Thoughts:  No  Homicidal Thoughts:  No  Memory:  Immediate;   Good Recent;   Good Remote;   Good  Judgement:  Good  Insight:  Good  Psychomotor Activity:  Normal  Concentration:  Concentration: Good and Attention Span: Good  Recall:  Good  Fund of Knowledge: Good  Language: Good  Akathisia:  No  Handed:  Right  AIMS (if indicated):  0  Assets:  Communication Skills Desire for Improvement Housing Social Support  ADL's:  Intact  Cognition: WNL  Sleep:  ok    Assessment: Major depressive disorder, recurrent.  Anxiety disorder NOS.  Plan: I discuss in length about need of medication and treatment.  We even offered to try a different medication if she feels sluggish with the Zoloft.  Patient reluctant to try any other medication.  She promised that she will try higher dose Zoloft and give more time for adjustment.  I recommended to take 50 mg Zoloft at night and 25 mg during the morning to avoid excessive sedation.  I also reviewed blood work results and collateral information from her primary care physician.  She recently she had blood work for thyroid and they are normal.  She will also need a new therapist for CBT.  She has lot of  negative and anxious symptoms.  Discuss safety concern that anytime having active suicidal thoughts or homicidal thought and she need to call 911 or go to the local emergency room.  Follow-up in 4 weeks.  Hershall Benkert T., MD 08/28/2016, 10:14 AM

## 2016-09-30 ENCOUNTER — Encounter (HOSPITAL_COMMUNITY): Payer: Self-pay | Admitting: Psychiatry

## 2016-09-30 ENCOUNTER — Ambulatory Visit (INDEPENDENT_AMBULATORY_CARE_PROVIDER_SITE_OTHER): Payer: 59 | Admitting: Psychiatry

## 2016-09-30 DIAGNOSIS — F419 Anxiety disorder, unspecified: Secondary | ICD-10-CM

## 2016-09-30 DIAGNOSIS — Z79899 Other long term (current) drug therapy: Secondary | ICD-10-CM

## 2016-09-30 DIAGNOSIS — F339 Major depressive disorder, recurrent, unspecified: Secondary | ICD-10-CM

## 2016-09-30 DIAGNOSIS — F321 Major depressive disorder, single episode, moderate: Secondary | ICD-10-CM

## 2016-09-30 DIAGNOSIS — Z818 Family history of other mental and behavioral disorders: Secondary | ICD-10-CM

## 2016-09-30 MED ORDER — SERTRALINE HCL 50 MG PO TABS: ORAL_TABLET | ORAL | 2 refills | Status: DC

## 2016-09-30 NOTE — Progress Notes (Signed)
BH MD/PA/NP OP Progress Note  09/30/2016 1:47 PM Jacqueline Frederick  MRN:  [id number]  Chief Complaint: I'm doing better with the medication.  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  She is taking Zoloft 75 mg and see has seen much improvement in her mood and depression.  She is also excited because she is able to get a job at [employer].  She is working second shift and she really like her job.  She denies any panic attack, irritability, crying spells or any feeling of hopelessness or worthlessness.  She is living with her father who is very supportive.  She sleeping good.  She denies any suicidal thoughts or homicidal thought.  Her energy level is good.  She is not seeing therapists but promised to see a therapist in the future if needed.  Patient told her job also offered counseling and if needed she can contact them .  Patient has no tremors, shakes or any EPS.  She denies any mania or psychosis.  She wants to continue Zoloft 75 mg daily.  Patient denies drinking alcohol or using any illegal substances.  Her vital signs are stable.    Visit Diagnosis:    ICD-10-CM   1. Moderate single current episode of major depressive disorder (HCC) F32.1 sertraline (ZOLOFT) 50 MG tablet    Past Psychiatric History: Reviewed. Patient reported history of depression and anxiety when she started college. Recently she was prescribed Zoloft by her primary care physician. Patient denies any history of suicidal attempt, mania, psychosis or any hallucination.  Past Medical History:  Past Medical History:  Diagnosis Date  . Asthma   . Sickle cell trait (HCC)    History reviewed. No pertinent surgical history.  Family Psychiatric History: Reviewed.  Family History:  Family History  Problem Relation Age of Onset  . Breast cancer Maternal Grandmother   . Bipolar disorder Paternal Aunt     Social History:  Social History   Social History  .  Marital status: Single    Spouse name: N/A  . Number of children: N/A  . Years of education: N/A   Occupational History  . student     Social History Main Topics  . Smoking status: Never Smoker  . Smokeless tobacco: Never Used  . Alcohol use No  . Drug use: No  . Sexual activity: No   Other Topics Concern  . None   Social History Narrative   [employer].     Allergies: No Known Allergies  Metabolic Disorder Labs: No results found for: HGBA1C, MPG No results found for: PROLACTIN No results found for: CHOL, TRIG, HDL, CHOLHDL, VLDL, LDLCALC Lab Results  Component Value Date   TSH 1.31 07/30/2016    Therapeutic Level Labs: No results found for: LITHIUM No results found for: VALPROATE No components found for:  CBMZ  Current Medications: Current Outpatient Prescriptions  Medication Sig Dispense Refill  . sertraline (ZOLOFT) 50 MG tablet Take 1 and 1/2 tab po daily 45 tablet 2   No current facility-administered medications for this visit.      Musculoskeletal: Strength & Muscle Tone: within normal limits Gait & Station: normal Patient leans: N/A  Psychiatric Specialty Exam: ROS  Blood pressure 106/71, pulse 63, height 5\' 4"  (1.626 m), weight 137 lb 3.2 oz (62.2 kg).Body mass index is 23.55 kg/m.  General Appearance: Casual  Eye Contact:  Good  Speech:  Clear and Coherent  Volume:  Normal  Mood:  Euthymic  Affect:  Appropriate  Thought Process:  Goal Directed  Orientation:  Full (Time, Place, and Person)  Thought Content: Logical   Suicidal Thoughts:  No  Homicidal Thoughts:  No  Memory:  Immediate;   Good Recent;   Good Remote;   Good  Judgement:  Good  Insight:  Good  Psychomotor Activity:  Normal  Concentration:  Concentration: Good and Attention Span: Good  Recall:  Good  Fund of Knowledge: Good  Language: Good  Akathisia:  No  Handed:  Right  AIMS (if indicated): not done  Assets:  Communication Skills Desire for  Improvement Housing Resilience Social Support  ADL's:  Intact  Cognition: WNL  Sleep:  Good   Screenings: GAD-7     Office Visit from 07/30/2016 in [place] [facility] [place] Office Visit from 04/16/2016 in [facility] [place]  Total GAD-7 Score  11  12    PHQ2-9     Office Visit from 07/30/2016 in [place] [facility] [place] Office Visit from 04/16/2016 in [place] [facility] [place]  PHQ-2 Total Score  2  6  PHQ-9 Total Score  15  22       Assessment and Plan: Major depressive disorder, recurrent.  Anxiety disorder NOS.  Patient doing much better since he is taking the medication.  She has no panic attacks.  Continue Zoloft 75 mg daily.  She is not interested in counseling but promised if needed she can call us for counseling.  Recommended to call us back if she has any question or any concern.  Follow-up in 3 months.   Jacqueline Frederick., MD 09/30/2016, 1:47 PM

## 2016-12-16 ENCOUNTER — Ambulatory Visit (INDEPENDENT_AMBULATORY_CARE_PROVIDER_SITE_OTHER): Payer: 59 | Admitting: Physician Assistant

## 2016-12-16 DIAGNOSIS — Z23 Encounter for immunization: Secondary | ICD-10-CM | POA: Diagnosis not present

## 2016-12-21 ENCOUNTER — Ambulatory Visit: Payer: 59 | Admitting: Family Medicine

## 2016-12-21 ENCOUNTER — Encounter: Payer: Self-pay | Admitting: Family Medicine

## 2016-12-21 VITALS — BP 112/64 | HR 109 | Temp 103.0°F | Ht 64.0 in | Wt 134.0 lb

## 2016-12-21 DIAGNOSIS — R6889 Other general symptoms and signs: Secondary | ICD-10-CM | POA: Diagnosis not present

## 2016-12-21 DIAGNOSIS — R509 Fever, unspecified: Secondary | ICD-10-CM

## 2016-12-21 DIAGNOSIS — Z30016 Encounter for initial prescription of transdermal patch hormonal contraceptive device: Secondary | ICD-10-CM | POA: Diagnosis not present

## 2016-12-21 LAB — POCT RAPID STREP A (OFFICE): RAPID STREP A SCREEN: NEGATIVE

## 2016-12-21 LAB — POCT INFLUENZA A/B
INFLUENZA A, POC: NEGATIVE
INFLUENZA B, POC: NEGATIVE

## 2016-12-21 MED ORDER — OSELTAMIVIR PHOSPHATE 75 MG PO CAPS: 75.0000 mg | ORAL_CAPSULE | Freq: Two times a day (BID) | ORAL | 0 refills | Status: DC

## 2016-12-21 MED ORDER — NORELGESTROMIN-ETH ESTRADIOL 150-35 MCG/24HR TD PTWK: 1.0000 | MEDICATED_PATCH | TRANSDERMAL | 12 refills | Status: DC

## 2016-12-21 NOTE — Progress Notes (Signed)
   Subjective:    Patient ID: Jacqueline Frederick, female    DOB: September 10, 1995, 21 y.o.   MRN: [id number]  HPI 21 yo female comes in today complaining of fever, cough sore throat for 3 days. No GI sxs. No nausea. She didn't check her temp at home.  She denies any runny nose.   Also wanted me to check her right upper back just underneath the shoulder strap area of her bra for rash.  She says she feels like the skin is and is got a burning sensation right there.  Review of Systems     Objective:   Physical Exam  Constitutional: She is oriented to person, place, and time. She appears well-developed and well-nourished.  HENT:  Head: Normocephalic and atraumatic.  Right Ear: External ear normal.  Left Ear: External ear normal.  Nose: Nose normal.  Mouth/Throat: Oropharynx is clear and moist.  TMs and canals are clear.   Eyes: Conjunctivae and EOM are normal. Pupils are equal, round, and reactive to light.  Neck: Neck supple. No thyromegaly present.  Cardiovascular: Normal rate, regular rhythm and normal heart sounds.  Pulmonary/Chest: Effort normal and breath sounds normal. She has no wheezes.  Lymphadenopathy:    She has no cervical adenopathy.  Neurological: She is alert and oriented to person, place, and time.  Skin: Skin is warm and dry.  Psychiatric: She has a normal mood and affect.          Assessment & Plan:  Flu like illness - will tx with tamiflu though rapid was negative. I am highly suspicious for flu.  Drink plain water.  Continue Tylenol/ibuprofen for fever and pain relief.  Call if not significantly better in the next 3 days.  Fever - given 600mg  IBU here in the office today.    During sensation of skin on right upper back-unclear etiology.  I do not see a rash encouraged her to keep an eye on the area.  Lungs are clear on exam.  She would also like to restart the Ortho Evra patch.  Options sent to pharmacy.

## 2016-12-22 ENCOUNTER — Ambulatory Visit: Payer: Self-pay | Admitting: Family Medicine

## 2016-12-30 ENCOUNTER — Ambulatory Visit (HOSPITAL_COMMUNITY): Payer: Self-pay | Admitting: Psychiatry

## 2017-02-09 ENCOUNTER — Telehealth (HOSPITAL_COMMUNITY): Payer: Self-pay | Admitting: Psychiatry

## 2017-02-09 ENCOUNTER — Encounter (HOSPITAL_COMMUNITY): Payer: Self-pay | Admitting: Psychiatry

## 2017-02-09 ENCOUNTER — Ambulatory Visit (INDEPENDENT_AMBULATORY_CARE_PROVIDER_SITE_OTHER): Payer: Self-pay | Admitting: Psychiatry

## 2017-02-09 VITALS — BP 100/64 | HR 67 | Ht 64.25 in | Wt 140.0 lb

## 2017-02-09 DIAGNOSIS — Z818 Family history of other mental and behavioral disorders: Secondary | ICD-10-CM

## 2017-02-09 DIAGNOSIS — F419 Anxiety disorder, unspecified: Secondary | ICD-10-CM

## 2017-02-09 DIAGNOSIS — F321 Major depressive disorder, single episode, moderate: Secondary | ICD-10-CM

## 2017-02-09 MED ORDER — SERTRALINE HCL 50 MG PO TABS: 75.0000 mg | ORAL_TABLET | Freq: Every day | ORAL | 2 refills | Status: DC

## 2017-02-09 NOTE — Progress Notes (Signed)
BH MD/PA/NP OP Progress Note  02/09/2017 1:28 PM Jacqueline Frederick  MRN:  [id number]  Chief Complaint: I had a good Christmas.  I am taking the medication.  HPI: Jacqueline Jacqueline Frederick came for her follow-up appointment.  She is taking Zoloft 75 mg and denies any side effects.  Her anxiety and depression is much better.  She denies any major panic attack.  She really like her job at [employer].  She made good friends.  She had a good Christmas and after the Christmas she went to visit her mother Jacqueline Frederick and she had a good time.  Patient denies any side effects.  Her sleep is improved.  She has no tremors shakes or any EPS.  She denies any mania or any psychosis.  She endorsed drinking on social occasion but denies any binge, intoxication or any blackouts.  Her energy level is good.  Her appetite is okay.  Her vital signs are stable.  Visit Diagnosis:    ICD-10-CM   1. Moderate single current episode of major depressive disorder (HCC) F32.1 sertraline (ZOLOFT) 50 MG tablet    Past Psychiatric History: Reviewed. Patient reported history of depression and anxiety when she started college. Recently she was prescribed Zoloft by her primary care physician. Patient denies any history of suicidal attempt, mania, psychosis or any hallucination.  Past Medical History:  Past Medical History:  Diagnosis Date  . Asthma   . Sickle cell trait (HCC)    No past surgical history on file.  Family Psychiatric History: Reviewed.  Family History:  Family History  Problem Relation Age of Onset  . Breast cancer Maternal Grandmother   . Bipolar disorder Paternal Aunt     Social History:  Social History   Socioeconomic History  . Marital status: Single    Spouse name: Not on file  . Number of children: Not on file  . Years of education: Not on file  . Highest education level: Not on file  Social Needs  . Financial resource strain: Not on file  . Food insecurity - worry: Not on file  . Food insecurity - inability:  Not on file  . Transportation needs - medical: Not on file  . Transportation needs - non-medical: Not on file  Occupational History  . Occupation: Consulting civil engineerstudent   Tobacco Use  . Smoking status: Never Smoker  . Smokeless tobacco: Never Used  Substance and Sexual Activity  . Alcohol use: No  . Drug use: No  . Sexual activity: No  Other Topics Concern  . Not on file  Social History Narrative   [employer].     Allergies: No Known Allergies  Metabolic Disorder Labs: No results found for: HGBA1C, MPG No results found for: PROLACTIN No results found for: CHOL, TRIG, HDL, CHOLHDL, VLDL, LDLCALC Lab Results  Component Value Date   TSH 1.31 07/30/2016    Therapeutic Level Labs: No results found for: LITHIUM No results found for: VALPROATE No components found for:  CBMZ  Current Medications: Current Outpatient Medications  Medication Sig Dispense Refill  . norelgestromin-ethinyl estradiol (ORTHO EVRA) 150-35 MCG/24HR transdermal patch Place 1 patch onto the skin once a week. 3 patch 12  . oseltamivir (TAMIFLU) 75 MG capsule Take 1 capsule (75 mg total) by mouth 2 (two) times daily. X 10 days 10 capsule 0   No current facility-administered medications for this visit.      Musculoskeletal: Strength & Muscle Tone: within normal limits Gait & Station: normal Patient leans: N/A  Psychiatric Specialty Exam:  ROS  Blood pressure 100/64, pulse 67, height 5' 4.25" (1.632 m), weight 140 lb (63.5 kg), SpO2 98 %.Body mass index is 23.84 kg/m.  General Appearance: Casual  Eye Contact:  Good  Speech:  Clear and Coherent  Volume:  Normal  Mood:  Euthymic  Affect:  Appropriate  Thought Process:  Goal Directed  Orientation:  Full (Time, Place, and Person)  Thought Content: Logical   Suicidal Thoughts:  No  Homicidal Thoughts:  No  Memory:  Immediate;   Good Recent;   Good Remote;   Good  Judgement:  Good  Insight:  Good  Psychomotor Activity:  Normal  Concentration:  Concentration:  Good and Attention Span: Good  Recall:  Good  Fund of Knowledge: Good  Language: Good  Akathisia:  No  Handed:  Right  AIMS (if indicated): not done  Assets:  Communication Skills Desire for Improvement Housing Resilience Social Support Talents/Skills  ADL's:  Intact  Cognition: WNL  Sleep:  Good   Screenings: GAD-7     Office Visit from 07/30/2016 in [place] [facility] [place] Office Visit from 04/16/2016 in [facility] [place]  Total GAD-7 Score  11  12    PHQ2-9     Office Visit from 07/30/2016 in [place] [facility] [place] Office Visit from 04/16/2016 in [place] [facility]   PHQ-2 Total Score  2  6  PHQ-9 Total Score  15  22       Assessment and Plan: Major depressive disorder, recurrent.  Anxiety disorder.  Patient doing better on her current medication.  Continue Zoloft 75 mg daily.  She is not interested in counseling.  She has no more panic attacks.  Discussed medication side effects and benefits.  Recommended to call us back if she has any question, concern if she feels worsening of the symptoms.  Follow-up in 3 months.   Jacqueline Nipper, MD 02/09/2017, 1:28 PM

## 2017-05-10 ENCOUNTER — Ambulatory Visit (HOSPITAL_COMMUNITY): Payer: Self-pay | Admitting: Psychiatry

## 2018-01-04 ENCOUNTER — Emergency Department (INDEPENDENT_AMBULATORY_CARE_PROVIDER_SITE_OTHER): Payer: Self-pay

## 2018-01-04 ENCOUNTER — Other Ambulatory Visit: Payer: Self-pay

## 2018-01-04 ENCOUNTER — Emergency Department (INDEPENDENT_AMBULATORY_CARE_PROVIDER_SITE_OTHER)
Admission: EM | Admit: 2018-01-04 | Discharge: 2018-01-04 | Disposition: A | Discharge status: Home / Self Care | Payer: Self-pay | Source: Home / Self Care | Attending: Family Medicine | Admitting: Family Medicine

## 2018-01-04 DIAGNOSIS — M542 Cervicalgia: Secondary | ICD-10-CM

## 2018-01-04 DIAGNOSIS — T07XXXA Unspecified multiple injuries, initial encounter: Secondary | ICD-10-CM

## 2018-01-04 DIAGNOSIS — M25551 Pain in right hip: Secondary | ICD-10-CM

## 2018-01-04 DIAGNOSIS — M79644 Pain in right finger(s): Secondary | ICD-10-CM

## 2018-01-04 DIAGNOSIS — S62650A Nondisplaced fracture of medial phalanx of right index finger, initial encounter for closed fracture: Secondary | ICD-10-CM

## 2018-01-04 DIAGNOSIS — M25552 Pain in left hip: Secondary | ICD-10-CM

## 2018-01-04 MED ORDER — CYCLOBENZAPRINE HCL 5 MG PO TABS: 5.0000 mg | ORAL_TABLET | Freq: Two times a day (BID) | ORAL | 0 refills | Status: DC | PRN
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
## 2018-01-04 NOTE — Discharge Instructions (Addendum)
°  You may take 500mg  acetaminophen every 4-6 hours or in combination with ibuprofen 400-600mg  every 6-8 hours as needed for pain and inflammation.   Flexeril (cyclobenzaprine) is a muscle relaxer and may cause drowsiness. Do not drink alcohol, drive, or operate heavy machinery while taking.  Please follow up with family medicine in 1 week if needed.

## 2018-01-21 ENCOUNTER — Ambulatory Visit: Payer: Self-pay | Admitting: Family Medicine
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
## 2018-01-24 ENCOUNTER — Ambulatory Visit: Payer: Self-pay | Admitting: Family Medicine

## 2018-01-25 ENCOUNTER — Encounter: Payer: Self-pay | Admitting: Physician Assistant

## 2018-01-25 ENCOUNTER — Ambulatory Visit (INDEPENDENT_AMBULATORY_CARE_PROVIDER_SITE_OTHER): Payer: Self-pay

## 2018-01-25 ENCOUNTER — Ambulatory Visit (INDEPENDENT_AMBULATORY_CARE_PROVIDER_SITE_OTHER): Payer: Self-pay | Admitting: Physician Assistant

## 2018-01-25 VITALS — BP 122/77 | HR 85 | Ht 64.0 in | Wt 201.0 lb

## 2018-01-25 DIAGNOSIS — M545 Low back pain, unspecified: Secondary | ICD-10-CM

## 2018-01-25 DIAGNOSIS — Z23 Encounter for immunization: Secondary | ICD-10-CM

## 2018-01-25 DIAGNOSIS — T148XXA Other injury of unspecified body region, initial encounter: Secondary | ICD-10-CM

## 2018-01-25 NOTE — Patient Instructions (Signed)
Hematoma  A hematoma is a collection of blood. A hematoma can happen:  · Under the skin.  · In an organ.  · In a body space.  · In a joint space.  · In other tissues.  The blood can thicken (clot) to form a lump that you can see and feel. The lump is often hard and may become sore and tender. The lump can be very small or very big. Most hematomas get better in a few days to weeks. However, some hematomas may be serious and need medical care.  What are the causes?  This condition is caused by:  · An injury.  · Blood that leaks under the skin.  · Problems from surgeries.  · Medical conditions that cause bleeding or bruising.  What increases the risk?  You are more likely to develop this condition if:  · You are an older adult.  · You use medicines that thin your blood.  What are the signs or symptoms?  Symptoms depend on where the hematoma is in your body.  · If the hematoma is under the skin, there is:  ? A firm lump on the body.  ? Pain and tenderness in the area.  ? Bruising. The skin above the lump may be blue, dark blue, purple-red, or yellowish.  · If the hematoma is deep in the tissues or body spaces, there may be:  ? Blood in the stomach. This may cause pain in the belly (abdomen), weakness, passing out (fainting), and shortness of breath.  ? Blood in the head. This may cause a headache, weakness, trouble speaking or understanding speech, or passing out.  How is this diagnosed?  This condition is diagnosed based on:  · Your medical history.  · A physical exam.  · Imaging tests, such as ultrasound or CT scan.  · Blood tests.  How is this treated?  Treatment depends on the cause, size, and location of the hematoma. Treatment may include:  · Doing nothing. Many hematomas go away on their own without treatment.  · Surgery or close monitoring. This may be needed for large hematomas or hematomas that affect the body's organs.  · Medicines. These may be given if a medical condition caused the hematoma.  Follow these  instructions at home:  Managing pain, stiffness, and swelling    · If told, put ice on the area.  ? Put ice in a plastic bag.  ? Place a towel between your skin and the bag.  ? Leave the ice on for 20 minutes, 2-3 times a day for the first two days.  · If told, put heat on the affected area after putting ice on the area for two days. Use the heat source that your doctor tells you to use. This could be a moist heat pack or a heating pad. To do this:  ? Place a towel between your skin and the heat source.  ? Leave the heat on for 20-30 minutes.  ? Remove the heat if your skin turns bright red. This is very important if you are unable to feel pain, heat, or cold. You may have a greater risk of getting burned.  · Raise (elevate) the affected area above the level of your heart while you are sitting or lying down.  · Wrap the affected area with an elastic bandage, if told by your doctor. Do not wrap the bandage too tightly.  · If your hematoma is on a leg or foot and   is painful, your doctor may give you crutches. Use them as told by your doctor.  General instructions  · Take over-the-counter and prescription medicines only as told by your doctor.  · Keep all follow-up visits as told by your doctor. This is important.  Contact a doctor if:  · You have a fever.  · The swelling or bruising gets worse.  · You start to get more hematomas.  Get help right away if:  · Your pain gets worse.  · Your pain is not getting better with medicine.  · Your skin over the hematoma breaks or starts to bleed.  · Your hematoma is in your chest or belly and you:  ? Pass out.  ? Feel weak.  ? Become short of breath.  · You have a hematoma on your scalp that is caused by a fall or injury, and you:  ? Have a headache that gets worse.  ? Have trouble speaking or understanding speech.  ? Become less alert or you pass out.  Summary  · A hematoma is a collection of blood in any part of your body.  · Most hematomas get better on their own in a few days  to weeks. Some may need medical care.  · Follow instructions from your doctor about how to care for your hematoma.  · Contact a doctor if the swelling or bruising gets worse, or if you are short of breath.  This information is not intended to replace advice given to you by your health care provider. Make sure you discuss any questions you have with your health care provider.  Document Released: 02/20/2004 Document Revised: 06/17/2017 Document Reviewed: 06/17/2017  Elsevier Interactive Patient Education © 2019 [employer]

## 2018-01-25 NOTE — Progress Notes (Signed)
Normal lumbar spine with good disc space and alignment. No acute findings. Your back pain is muscle strain around the lumbar spine. Massage, heat, icy/hot patches/biofreeze can help with this.
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
## 2018-01-26 ENCOUNTER — Encounter: Payer: Self-pay | Admitting: Physician Assistant

## 2018-03-08 ENCOUNTER — Other Ambulatory Visit: Payer: Self-pay | Admitting: Family Medicine

## 2018-04-29 ENCOUNTER — Ambulatory Visit (INDEPENDENT_AMBULATORY_CARE_PROVIDER_SITE_OTHER): Payer: Self-pay | Admitting: Family Medicine

## 2018-04-29 ENCOUNTER — Other Ambulatory Visit: Payer: Self-pay

## 2018-04-29 ENCOUNTER — Telehealth: Payer: Self-pay

## 2018-04-29 ENCOUNTER — Encounter: Payer: Self-pay | Admitting: Family Medicine

## 2018-04-29 VITALS — BP 120/90 | Wt 194.0 lb

## 2018-04-29 DIAGNOSIS — F329 Major depressive disorder, single episode, unspecified: Secondary | ICD-10-CM

## 2018-04-29 DIAGNOSIS — F321 Major depressive disorder, single episode, moderate: Secondary | ICD-10-CM

## 2018-04-29 DIAGNOSIS — F32A Depression, unspecified: Secondary | ICD-10-CM

## 2018-04-29 MED ORDER — SERTRALINE HCL 50 MG PO TABS: ORAL_TABLET | ORAL | 0 refills | Status: DC

## 2018-04-29 NOTE — Telephone Encounter (Signed)
Ok to put on this afternon as long as after 2.

## 2018-04-29 NOTE — Progress Notes (Signed)
Virtual Visit via telephone  I connected with Jacqueline Frederick on 04/29/18 at  2:20 PM EDT by  telemedicine application and verified that I am speaking with the correct person using two identifiers.  We attempted to do a video visit and could not get it to connect.  Patient was at home and provider in the office at [facility] for the tele-visit.   I discussed the limitations of evaluation and management by telemedicine and the availability of in person appointments. The patient expressed understanding and agreed to proceed.  Subjective:    CC: Depression - would like to restart medication.   HPI:  23 yo female called for refills on her refills on sertraline.  She is off her medication.   She is feeling stressed and down.  She is sleeping fair at night. She is out of work right now.  She does feel like she has some good support from her family.  She did not want to go into any specifics about why she was feeling down but did say that what is going on with the pandemic has been stressful for her.  She would like to restart her sertraline which she did well with in the past.  Past medical history, Surgical history, Family history not pertinant except as noted below, Social history, Allergies, and medications have been entered into the medical record, reviewed, and corrections made.   Review of Systems: No fevers, chills, night sweats, weight loss, chest pain, or shortness of breath.   Objective:    General: Speaking clearly in complete sentences without any shortness of breath.  Alert and oriented x3.  Normal judgment. No apparent acute distress.    Impression and Recommendations:   Depression/Anxiety -  PHQ - 9 score of 8 today. Dsicussed options.  We will restart the sertraline she can start with half a tab and then go up to a whole tab.  She previously got up to 75 mg so in a couple weeks she is welcome to call me if she would like me to adjust her dose.  Offered to refer her to  our therapist who is doing virtual visits for behavioral health if she is open to that.    I discussed the assessment and treatment plan with the patient. The patient was provided an opportunity to ask questions and all were answered. The patient agreed with the plan and demonstrated an understanding of the instructions.   The patient was advised to call back or seek an in-person evaluation if the symptoms worsen or if the condition fails to improve as anticipated.  I provided 15 minutes of non-face-to-face time during this encounter.  Greater than 50% of time spent counseling about depression.   Nani Gasser, MD

## 2018-04-29 NOTE — Telephone Encounter (Signed)
Patient scheduled.

## 2018-04-29 NOTE — Telephone Encounter (Signed)
I noticed appointments have been opened for the afternoon. I tried to call patient to schedule her an appointment. No answer, left a message for a return call to schedule.

## 2018-04-29 NOTE — Telephone Encounter (Signed)
Lark called and states she would like a refill on Sertraline. I advised patient she will need an appointment. I tried to schedule her for Monday. She was wanting to come in today. There are no openings today. She states she will call back.

## 2018-05-02 ENCOUNTER — Ambulatory Visit: Payer: Self-pay | Admitting: Family Medicine

## 2018-05-19 ENCOUNTER — Telehealth: Payer: Self-pay

## 2018-05-19 DIAGNOSIS — F419 Anxiety disorder, unspecified: Secondary | ICD-10-CM

## 2018-05-19 NOTE — Telephone Encounter (Signed)
Absolutely.pls place referral.  Thank you.

## 2018-05-19 NOTE — Telephone Encounter (Signed)
Jacqueline Frederick called and requested a referral to [facility] for psychiatry. She would like to be referred for anxiety. Please advise.

## 2018-05-19 NOTE — Telephone Encounter (Signed)
Referral placed.

## 2018-07-20 ENCOUNTER — Other Ambulatory Visit: Payer: Self-pay | Admitting: Family Medicine

## 2018-07-20 DIAGNOSIS — F321 Major depressive disorder, single episode, moderate: Secondary | ICD-10-CM
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
## 2019-09-01 ENCOUNTER — Other Ambulatory Visit: Payer: Self-pay

## 2019-09-01 ENCOUNTER — Emergency Department
Admission: EM | Admit: 2019-09-01 | Discharge: 2019-09-01 | Disposition: A | Discharge status: Home / Self Care | Payer: Self-pay | Source: Home / Self Care | Attending: Family Medicine | Admitting: Family Medicine

## 2019-09-01 DIAGNOSIS — M25571 Pain in right ankle and joints of right foot: Secondary | ICD-10-CM

## 2019-09-01 NOTE — ED Provider Notes (Signed)
[facility]    CSN: [id number] Arrival date & time: 09/01/19  1047      History   Chief Complaint Chief Complaint  Patient presents with  . Ankle Pain  . Letter for School/Work    HPI Jacqueline Frederick is a 24 y.o. female.   Patient complains of right ankle pain/swelling for several days.  She states that she just started a new job that involves standing all day, and her feet/ankles swell.  She may have twisted her right ankle several days ago.  She recalls that she did sprain her right ankle several months ago and it did seem to be healed.  The history is provided by the patient.  Ankle Pain Location:  Ankle Time since incident:  3 days Injury: yes   Mechanism of injury comment:  Twisted ankle Ankle location:  R ankle Pain details:    Quality:  Aching   Radiates to:  Does not radiate   Severity:  Mild   Onset quality:  Gradual   Duration:  3 days   Timing:  Constant   Progression:  Unchanged Chronicity:  New Prior injury to area:  Yes Relieved by:  None tried Worsened by:  Bearing weight Ineffective treatments:  None tried Associated symptoms: decreased ROM and swelling   Associated symptoms: no numbness and no tingling     Past Medical History:  Diagnosis Date  . Asthma   . Sickle cell trait [facility])     Patient Active Problem List   Diagnosis Date Noted  . Acute depression 07/30/2016  . Seborrheic dermatitis of scalp 04/09/2012  . Sickle cell trait (HCC) 02/06/2011    History reviewed. No pertinent surgical history.  OB History   No obstetric history on file.      Home Medications    Prior to Admission medications   Medication Sig Start Date End Date Taking? Authorizing Provider  sertraline (ZOLOFT) 50 MG tablet Take 1 tablet (50 mg total) by mouth daily. 07/20/18   Agapito Games, MD    Family History Family History  Problem Relation Age of Onset  . Thyroid disease Mother   . Healthy Father   . Breast cancer Maternal  Grandmother   . Bipolar disorder Paternal Aunt     Social History Social History   Tobacco Use  . Smoking status: Never Smoker  . Smokeless tobacco: Never Used  Vaping Use  . Vaping Use: Never used  Substance Use Topics  . Alcohol use: Yes    Comment: Occassional drink   . Drug use: No     Allergies   Patient has no known allergies.   Review of Systems Review of Systems  Constitutional: Positive for activity change.  Musculoskeletal:       Right ankle swelling  Skin: Negative for color change.  All other systems reviewed and are negative.    Physical Exam Triage Vital Signs ED Triage Vitals  Enc Vitals Group     BP 09/01/19 1103 126/76     Pulse Rate 09/01/19 1103 74     Resp 09/01/19 1103 16     Temp 09/01/19 1103 99.3 F (37.4 C)     Temp Source 09/01/19 1103 Oral     SpO2 09/01/19 1103 100 %     Weight --      Height --      Head Circumference --      Peak Flow --      Pain Score 09/01/19 1102 0  Pain Loc --      Pain Edu? --      Excl. in GC? --    No data found.  Updated Vital Signs BP 126/76 (BP Location: Right Arm)   Pulse 74   Temp 99.3 F (37.4 C) (Oral)   Resp 16   SpO2 100%   Visual Acuity Right Eye Distance:   Left Eye Distance:   Bilateral Distance:    Right Eye Near:   Left Eye Near:    Bilateral Near:     Physical Exam Vitals and nursing note reviewed.  Constitutional:      General: She is not in acute distress. HENT:     Head: Atraumatic.     Mouth/Throat:     Mouth: Mucous membranes are moist.  Eyes:     Pupils: Pupils are equal, round, and reactive to light.  Cardiovascular:     Rate and Rhythm: Normal rate.  Pulmonary:     Effort: Pulmonary effort is normal.  Musculoskeletal:     Cervical back: Normal range of motion.     Right upper leg: Swelling and tenderness present. No deformity.     Comments: Right ankle has full range of motion.  There is mild swelling of foot and ankle.  Minimal tenderness medial  and lateral malleoli.  Skin:    General: Skin is warm and dry.  Neurological:     Mental Status: She is alert.      UC Treatments / Results  Labs (all labs ordered are listed, but only abnormal results are displayed) Labs Reviewed - No data to display  EKG   Radiology No results found.  Procedures Procedures (including critical care time)  Medications Ordered in UC Medications - No data to display  Initial Impression / Assessment and Plan / UC Course  I have reviewed the triage vital signs and the nursing notes.  Pertinent labs & imaging results that were available during my care of the patient were reviewed by me and considered in my medical decision making (see chart for details).    Right ankle does not appear acutely sprained.  Note that patient had a sprain several months ago that apparently healed. Ace wrap applied. Given sprain treatment instructions with range of motion and stretching exercises.  Followup with Dr. Rodney [facility] ([facility]) if not improving about two weeks.   Final Clinical Impressions(s) / UC Diagnoses   Final diagnoses:  Acute right ankle pain     Discharge Instructions     Apply ice pack for 20 to 30 minutes, 3 to 4 times daily  Continue until pain and swelling decrease.  Wear ace wrap until swelling resolves.  May take Ibuprofen 200mg , 4 tabs every 8 hours with food.  Begin ankle range of motion and strengthening exercises as tolerated.    ED Prescriptions    None        , MD 09/04/19 1018

## 2019-09-01 NOTE — Discharge Instructions (Addendum)
Apply ice pack for 20 to 30 minutes, 3 to 4 times daily  Continue until pain and swelling decrease.  Wear ace wrap until swelling resolves.  May take Ibuprofen 200mg , 4 tabs every 8 hours with food.  Begin ankle range of motion and strengthening exercises as tolerated.

## 2019-09-01 NOTE — ED Triage Notes (Signed)
Patient presents to Urgent Care with complaints of right ankle pain since she twisted it a few days ago. Patient reports she needs a note stating she was out yesterday and that it is okay for her to return today.

## 2019-11-30 ENCOUNTER — Telehealth: Payer: Self-pay | Admitting: Family Medicine

## 2019-11-30 NOTE — Telephone Encounter (Signed)
Call pt: due for pap smear. Please schedule her

## 2019-12-01 NOTE — Telephone Encounter (Signed)
Left voicemail for patient to call us back.

## 2020-04-19 ENCOUNTER — Encounter: Payer: Self-pay | Admitting: Family Medicine

## 2020-09-24 ENCOUNTER — Ambulatory Visit: Payer: BC Managed Care – PPO | Admitting: Medical-Surgical

## 2020-10-08 ENCOUNTER — Ambulatory Visit: Payer: BC Managed Care – PPO | Admitting: Family Medicine
